# Patient Record
Sex: Male | Born: 1950 | Race: White | Hispanic: No | Marital: Married | Smoking: Never smoker
Health system: Southern US, Community
[De-identification: ages and names within clinical notes are randomized; demographics above are authoritative.]

## PROBLEM LIST (undated history)

## (undated) DIAGNOSIS — G4730 Sleep apnea, unspecified: Secondary | ICD-10-CM

## (undated) DIAGNOSIS — R918 Other nonspecific abnormal finding of lung field: Secondary | ICD-10-CM

## (undated) DIAGNOSIS — E785 Hyperlipidemia, unspecified: Secondary | ICD-10-CM

## (undated) DIAGNOSIS — M1990 Unspecified osteoarthritis, unspecified site: Secondary | ICD-10-CM

## (undated) DIAGNOSIS — K429 Umbilical hernia without obstruction or gangrene: Secondary | ICD-10-CM

## (undated) DIAGNOSIS — B159 Hepatitis A without hepatic coma: Secondary | ICD-10-CM

## (undated) DIAGNOSIS — E7800 Pure hypercholesterolemia, unspecified: Secondary | ICD-10-CM

## (undated) DIAGNOSIS — K649 Unspecified hemorrhoids: Secondary | ICD-10-CM

## (undated) DIAGNOSIS — I700 Atherosclerosis of aorta: Secondary | ICD-10-CM

## (undated) DIAGNOSIS — I4891 Unspecified atrial fibrillation: Secondary | ICD-10-CM

## (undated) DIAGNOSIS — K760 Fatty (change of) liver, not elsewhere classified: Secondary | ICD-10-CM

## (undated) DIAGNOSIS — I499 Cardiac arrhythmia, unspecified: Secondary | ICD-10-CM

## (undated) DIAGNOSIS — H409 Unspecified glaucoma: Secondary | ICD-10-CM

## (undated) DIAGNOSIS — N529 Male erectile dysfunction, unspecified: Secondary | ICD-10-CM

## (undated) HISTORY — PX: POLYPECTOMY: SHX149

## (undated) HISTORY — PX: APPENDECTOMY: SHX54

## (undated) HISTORY — PX: UMBILICAL HERNIA REPAIR: SHX196

---

## 1992-11-27 HISTORY — PX: KNEE ARTHROSCOPY: SHX127

## 2004-08-29 ENCOUNTER — Emergency Department: Payer: Self-pay | Admitting: Emergency Medicine

## 2005-08-04 ENCOUNTER — Ambulatory Visit: Payer: Self-pay | Admitting: Gastroenterology

## 2007-09-02 ENCOUNTER — Ambulatory Visit: Payer: Self-pay | Admitting: General Surgery

## 2008-11-17 ENCOUNTER — Ambulatory Visit: Payer: Self-pay | Admitting: Gastroenterology

## 2015-10-27 ENCOUNTER — Other Ambulatory Visit: Payer: Self-pay | Admitting: Orthopedic Surgery

## 2015-10-27 DIAGNOSIS — S83221A Peripheral tear of medial meniscus, current injury, right knee, initial encounter: Secondary | ICD-10-CM

## 2015-11-09 ENCOUNTER — Ambulatory Visit
Admission: RE | Admit: 2015-11-09 | Discharge: 2015-11-09 | Disposition: A | Discharge status: Home / Self Care | Payer: BC Managed Care – PPO | Source: Ambulatory Visit | Attending: Orthopedic Surgery | Admitting: Orthopedic Surgery

## 2015-11-09 DIAGNOSIS — S83221A Peripheral tear of medial meniscus, current injury, right knee, initial encounter: Secondary | ICD-10-CM | POA: Insufficient documentation

## 2015-11-09 DIAGNOSIS — X58XXXA Exposure to other specified factors, initial encounter: Secondary | ICD-10-CM | POA: Insufficient documentation

## 2015-11-09 DIAGNOSIS — M25561 Pain in right knee: Secondary | ICD-10-CM | POA: Diagnosis not present

## 2016-07-22 ENCOUNTER — Emergency Department: Payer: BC Managed Care – PPO

## 2016-07-22 ENCOUNTER — Encounter: Payer: Self-pay | Admitting: Emergency Medicine

## 2016-07-22 ENCOUNTER — Observation Stay
Admission: EM | Admit: 2016-07-22 | Discharge: 2016-07-24 | Disposition: A | Discharge status: Home / Self Care | Payer: BC Managed Care – PPO | Attending: Specialist | Admitting: Specialist

## 2016-07-22 DIAGNOSIS — Z808 Family history of malignant neoplasm of other organs or systems: Secondary | ICD-10-CM | POA: Insufficient documentation

## 2016-07-22 DIAGNOSIS — R110 Nausea: Secondary | ICD-10-CM | POA: Diagnosis present

## 2016-07-22 DIAGNOSIS — K831 Obstruction of bile duct: Secondary | ICD-10-CM | POA: Insufficient documentation

## 2016-07-22 DIAGNOSIS — R7401 Elevation of levels of liver transaminase levels: Secondary | ICD-10-CM

## 2016-07-22 DIAGNOSIS — E785 Hyperlipidemia, unspecified: Secondary | ICD-10-CM | POA: Insufficient documentation

## 2016-07-22 DIAGNOSIS — R7989 Other specified abnormal findings of blood chemistry: Secondary | ICD-10-CM | POA: Diagnosis not present

## 2016-07-22 DIAGNOSIS — R17 Unspecified jaundice: Secondary | ICD-10-CM | POA: Diagnosis present

## 2016-07-22 DIAGNOSIS — M1990 Unspecified osteoarthritis, unspecified site: Secondary | ICD-10-CM | POA: Insufficient documentation

## 2016-07-22 DIAGNOSIS — H409 Unspecified glaucoma: Secondary | ICD-10-CM | POA: Diagnosis not present

## 2016-07-22 DIAGNOSIS — K760 Fatty (change of) liver, not elsewhere classified: Principal | ICD-10-CM | POA: Insufficient documentation

## 2016-07-22 DIAGNOSIS — Z8371 Family history of colonic polyps: Secondary | ICD-10-CM | POA: Diagnosis not present

## 2016-07-22 DIAGNOSIS — K712 Toxic liver disease with acute hepatitis: Secondary | ICD-10-CM | POA: Insufficient documentation

## 2016-07-22 DIAGNOSIS — T360X5A Adverse effect of penicillins, initial encounter: Secondary | ICD-10-CM | POA: Insufficient documentation

## 2016-07-22 DIAGNOSIS — R740 Nonspecific elevation of levels of transaminase and lactic acid dehydrogenase [LDH]: Secondary | ICD-10-CM

## 2016-07-22 DIAGNOSIS — R935 Abnormal findings on diagnostic imaging of other abdominal regions, including retroperitoneum: Secondary | ICD-10-CM

## 2016-07-22 DIAGNOSIS — B179 Acute viral hepatitis, unspecified: Secondary | ICD-10-CM | POA: Diagnosis present

## 2016-07-22 HISTORY — DX: Male erectile dysfunction, unspecified: N52.9

## 2016-07-22 HISTORY — DX: Hepatitis a without hepatic coma: B15.9

## 2016-07-22 HISTORY — DX: Unspecified glaucoma: H40.9

## 2016-07-22 HISTORY — DX: Unspecified osteoarthritis, unspecified site: M19.90

## 2016-07-22 HISTORY — DX: Hyperlipidemia, unspecified: E78.5

## 2016-07-22 LAB — COMPREHENSIVE METABOLIC PANEL
ALBUMIN: 4.3 g/dL (ref 3.5–5.0)
ALK PHOS: 187 U/L — AB (ref 38–126)
ALT: 522 U/L — AB (ref 17–63)
AST: 222 U/L — ABNORMAL HIGH (ref 15–41)
Anion gap: 9 (ref 5–15)
BILIRUBIN TOTAL: 9.5 mg/dL — AB (ref 0.3–1.2)
BUN: 12 mg/dL (ref 6–20)
CALCIUM: 9 mg/dL (ref 8.9–10.3)
CO2: 23 mmol/L (ref 22–32)
CREATININE: 0.65 mg/dL (ref 0.61–1.24)
Chloride: 105 mmol/L (ref 101–111)
GFR calc Af Amer: >60 mL/min (ref >60)
GFR calc non Af Amer: >60 mL/min (ref >60)
GLUCOSE: 111 mg/dL — AB (ref 65–99)
Potassium: 3.6 mmol/L (ref 3.5–5.1)
SODIUM: 137 mmol/L (ref 135–145)
TOTAL PROTEIN: 8 g/dL (ref 6.5–8.1)

## 2016-07-22 LAB — PROTIME-INR
INR: 0.87
PROTHROMBIN TIME: 11.8 s (ref 11.4–15.2)

## 2016-07-22 LAB — LIPASE, BLOOD: Lipase: 27 U/L (ref 11–51)

## 2016-07-22 LAB — BILIRUBIN, DIRECT: Bilirubin, Direct: 5.6 mg/dL — ABNORMAL HIGH (ref 0.1–0.5)

## 2016-07-22 LAB — ACETAMINOPHEN LEVEL: Acetaminophen (Tylenol), Serum: <10 ug/mL — ABNORMAL LOW (ref 10–30)

## 2016-07-22 LAB — APTT: aPTT: 30 seconds (ref 24–36)

## 2016-07-22 LAB — GAMMA GT: GGT: 662 U/L — ABNORMAL HIGH (ref 7–50)

## 2016-07-22 MED ORDER — SODIUM CHLORIDE 0.9 % IV BOLUS (SEPSIS)
500.0000 mL | Freq: Once | INTRAVENOUS | Status: AC
  Administered 2016-07-22: 500 mL via INTRAVENOUS

## 2016-07-22 MED ORDER — ALBUTEROL SULFATE (2.5 MG/3ML) 0.083% IN NEBU: 2.5000 mg | INHALATION_SOLUTION | RESPIRATORY_TRACT | Status: DC | PRN

## 2016-07-22 MED ORDER — DIPHENHYDRAMINE HCL 25 MG PO CAPS
25.0000 mg | ORAL_CAPSULE | Freq: Four times a day (QID) | ORAL | Status: DC | PRN
  Administered 2016-07-23 (×3): 25 mg via ORAL
  Filled 2016-07-22 (×3): qty 1

## 2016-07-22 MED ORDER — IOPAMIDOL (ISOVUE-300) INJECTION 61%
100.0000 mL | Freq: Once | INTRAVENOUS | Status: AC | PRN
  Administered 2016-07-22: 100 mL via INTRAVENOUS

## 2016-07-22 MED ORDER — POLYETHYLENE GLYCOL 3350 17 G PO PACK: 17.0000 g | PACK | Freq: Every day | ORAL | Status: DC | PRN

## 2016-07-22 MED ORDER — IBUPROFEN 400 MG PO TABS
400.0000 mg | ORAL_TABLET | Freq: Four times a day (QID) | ORAL | Status: DC | PRN
Start: 2016-07-22 — End: 2016-07-24
  Filled 2016-07-22: qty 1

## 2016-07-22 MED ORDER — DIPHENHYDRAMINE HCL 50 MG/ML IJ SOLN
12.5000 mg | Freq: Once | INTRAMUSCULAR | Status: AC
  Administered 2016-07-22: 12.5 mg via INTRAVENOUS
  Filled 2016-07-22: qty 1

## 2016-07-22 MED ORDER — DIATRIZOATE MEGLUMINE & SODIUM 66-10 % PO SOLN
15.0000 mL | Freq: Once | ORAL | Status: AC
  Administered 2016-07-22: 15 mL via ORAL

## 2016-07-22 MED ORDER — POTASSIUM CHLORIDE IN NACL 20-0.9 MEQ/L-% IV SOLN
INTRAVENOUS | Status: DC
  Administered 2016-07-22 – 2016-07-23 (×4): via INTRAVENOUS
  Filled 2016-07-22 (×6): qty 1000

## 2016-07-22 MED ORDER — ONDANSETRON HCL 4 MG/2ML IJ SOLN: 4.0000 mg | Freq: Four times a day (QID) | INTRAMUSCULAR | Status: DC | PRN

## 2016-07-22 MED ORDER — ONDANSETRON HCL 4 MG PO TABS: 4.0000 mg | ORAL_TABLET | Freq: Four times a day (QID) | ORAL | Status: DC | PRN

## 2016-07-22 NOTE — Consult Note (Signed)
Hepatology Inpatient Consult Note  Reason for Consult:jaundice   Attending Requesting Consult:Dr. Sudini  History of Present Illness: Darrell Burton is a 65 y.o. male who had URI and took Augmentin and prednisone for 10 days in early August.  Two weeks later he had onset of nausea, reflux, stomach burning with eating.  His urine became darker as days went by.  He also had purritis  Which worsened as time went by.He had a urine culture that was neg.  He had blood work done that showed very elevated LFT"S and was admitted to the hospital.  He was in [place] 7/15 to 7/29.  Stools have become light brown and urine dark orange.  Past Medical History:  Past Medical History:  Diagnosis Date  . Erectile dysfunction   . Glaucoma   . Hepatitis A   . Hyperlipidemia   . Osteoarthritis     Problem List: Patient Active Problem List   Diagnosis Date Noted  . Acute hepatitis 07/22/2016    Past Surgical History: History reviewed. No pertinent surgical history.  Allergies: No Known Allergies  Home Medications: Prescriptions Prior to Admission  Medication Sig Dispense Refill Last Dose  . albuterol (PROVENTIL HFA;VENTOLIN HFA) 108 (90 Base) MCG/ACT inhaler Inhale 2 puffs into the lungs every 6 (six) hours as needed for wheezing.   Past Month at Unknown time  . aspirin EC 81 MG tablet Take 81 mg by mouth every morning.   07/19/2016  . latanoprost (XALATAN) 0.005 % ophthalmic solution Place 1 drop into both eyes at bedtime.   07/21/2016 at Unknown time  . montelukast (SINGULAIR) 10 MG tablet Take 10 mg by mouth daily as needed for allergies.   never used   Home medication reconciliation was completed with the patient.   Scheduled Inpatient Medications:     Continuous Inpatient Infusions:   . 0.9 % NaCl with KCl 20 mEq / L 100 mL/hr at 07/22/16 1906    PRN Inpatient Medications:  albuterol, ibuprofen, ondansetron **OR** ondansetron (ZOFRAN) IV, polyethylene glycol  Family History: family  history includes Colon polyps in his father; Melanoma in his father.  The patient's family history is negative for inflammatory bowel disorders, GI malignancy, or solid organ transplantation.  Social History:   reports that he has never smoked. He has never used smokeless tobacco. He reports that he does not drink alcohol or use drugs. The patient denies ETOH, tobacco, or drug use.   Review of Systems: Constitutional: Weight is stable.  Eyes: No changes in vision. ENT: No oral lesions, sore throat.  GI: see HPI.  Heme/Lymph: No easy bruising.  CV: No chest pain.  GU: No hematuria.  Integumentary: No rashes.  Neuro: No headaches.  Psych: No depression/anxiety.  Endocrine: No heat/cold intolerance.  Allergic/Immunologic: No urticaria.  Resp: No cough, SOB.  Musculoskeletal: No joint swelling.    Physical Examination: BP 133/81 (BP Location: Left Arm)   Pulse 60   Temp 98.1 F (36.7 C) (Oral)   Resp 16   Ht 5\' 8"  (1.727 m)   Wt 103 kg (227 lb)   SpO2 97%   BMI 34.52 kg/m  Gen: NAD, alert and oriented x 4 HEENT: PEERLA, EOMI, Neck: supple, no JVD or thyromegaly Chest: CTA bilaterally, no wheezes, crackles, or other adventitious sounds CV: RRR, no m/g/c/r Abd: soft, NT, ND, +BS in all four quadrants; no HSM, guarding, ridigity, or rebound tenderness Ext: no edema, well perfused with 2+ pulses, Skin: no rash or lesions noted Lymph: no  LAD  Data: No results found for: WBC, HGB, HCT, MCV, PLT No results for input(s): HGB in the last 168 hours. Lab Results  Component Value Date   NA 137 07/22/2016   K 3.6 07/22/2016   CL 105 07/22/2016   CO2 23 07/22/2016   BUN 12 07/22/2016   CREATININE 0.65 07/22/2016   Lab Results  Component Value Date   ALT 522 (H) 07/22/2016   AST 222 (H) 07/22/2016   ALKPHOS 187 (H) 07/22/2016   BILITOT 9.5 (H) 07/22/2016    Recent Labs Lab 07/22/16 1411  APTT 30  INR 0.87   Assessment/Plan: Darrell Burton is a 65 y.o. male with recent  Augmentin for URI.  Augmentin is one of the most common causes of drug induced liver disease.  This is likely the cause of this illness.  He needs to avoid this medicine and penicillin if at all possible.  Will follow with you.  Check LFT's tomorrow, when trending down can go home and follow up as an outpatient.  Will give Benadryl for sleep and mild puritis.  Recommendations:  Thank you for the consult. Please call with questions or concerns.  Gaylyn Cheers, MD

## 2016-07-22 NOTE — ED Provider Notes (Signed)
[facility] Emergency Department Provider Note    First MD Initiated Contact with Patient 07/22/16 1353     (approximate)  I have reviewed the triage vital signs and the nursing notes.   HISTORY  Chief Complaint Abnormal Lab    HPI Darrell Burton is a 65 y.o. male presents to the ER at the direction of his primary care physician due to abnormal labs. Patient states that he has been having generalized nausea over the past week. He also noticed that he started becoming jaundiced. Would intermittently have very brief episodes of right upper quadrant pain but none today. Went into his primary care physician for evaluation related routine lab work and noted that he had significant transaminitis with an elevated bilirubin.  He admits to occasional alcohol use. No recent Tylenol use. Does have a family history of pancreatic cancer. Has noted 4 days of acholic stool.  Denies any previous surgeries to his abdomen. No recent infections or fevers. No new medications.   Past Medical History:  Diagnosis Date  . Hepatitis A     There are no active problems to display for this patient.   PSH: No recent surgeries  Prior to Admission medications   Not on File    Allergies Review of patient's allergies indicates no known allergies.  [place]:  Mother had pancreatic cancer  Social History Social History  Substance Use Topics  . Smoking status: Not on file  . Smokeless tobacco: Not on file  . Alcohol use Not on file    Review of Systems Patient denies headaches, rhinorrhea, blurry vision, numbness, shortness of breath, chest pain, edema, cough, abdominal pain, nausea, vomiting, diarrhea, dysuria, fevers, rashes or hallucinations unless otherwise stated above in HPI. ____________________________________________   PHYSICAL EXAM:  VITAL SIGNS: Vitals:   07/22/16 1251  BP: 135/87  Pulse: (!) 57  Resp: 18  Temp: 98.9 F (37.2 C)    Constitutional: Alert and  oriented. Well appearing and in no acute distress. Eyes: Conjunctivae jaundiced. PERRL. EOMI. Head: Atraumatic. Nose: No congestion/rhinnorhea. Mouth/Throat: Mucous membranes are moist.  Oropharynx non-erythematous. Neck: No stridor. Painless ROM. No cervical spine tenderness to palpation Hematological/Lymphatic/Immunilogical: No cervical lymphadenopathy. Cardiovascular: Normal rate, regular rhythm. Grossly normal heart sounds.  Good peripheral circulation. Respiratory: Normal respiratory effort.  No retractions. Lungs CTAB. Gastrointestinal: Soft and nontender. No distention. No abdominal bruits. No CVA tenderness. Genitourinary:  Musculoskeletal: No lower extremity tenderness nor edema.  No joint effusions. Neurologic:  Normal speech and language. No gross focal neurologic deficits are appreciated. No gait instability. Skin:  Skin is warm, dry and intact. No rash noted. Psychiatric: Mood and affect are normal. Speech and behavior are normal.  ____________________________________________   LABS (all labs ordered are listed, but only abnormal results are displayed)  Results for orders placed or performed during the hospital encounter of 07/22/16 (from the past 24 hour(s))  Protime-INR     Status: None   Collection Time: 07/22/16  2:11 PM  Result Value Ref Range   Prothrombin Time 11.8 11.4 - 15.2 seconds   INR 0.87   Lipase, blood     Status: None   Collection Time: 07/22/16  2:11 PM  Result Value Ref Range   Lipase 27 11 - 51 U/L   ____________________________________________   ____________________________________________  RADIOLOGY RUQ [place] with  IMPRESSION: 1. Gallbladder wall thickening without evidence for stones, pericholecystic fluid, or sonographic Murphy sign. The thickening may be related to the underdistended state of the gallbladder.  If clinical exam is equivocal for cholecystitis, nuclear scintigraphy may prove helpful to further evaluate. 2. No intra or  extrahepatic biliary duct dilatation. 3. Increased echogenicity of the liver parenchyma likely related to steatosis.   CT abd IMPRESSION: 1. Gallbladder wall thickening without evidence for stones, pericholecystic fluid, or sonographic Murphy sign. The thickening may be related to the underdistended state of the gallbladder. If clinical exam is equivocal for cholecystitis, nuclear scintigraphy may prove helpful to further evaluate. 2. No intra or extrahepatic biliary duct dilatation. 3. Increased echogenicity of the liver parenchyma likely related to steatosis.  ____________________________________________   PROCEDURES  Procedure(s) performed: none    Critical Care performed: no ____________________________________________   INITIAL IMPRESSION / ASSESSMENT AND PLAN / ED COURSE  Pertinent labs & imaging results that were available during my care of the patient were reviewed by me and considered in my medical decision making (see chart for details).  DDX: Pancreatic mass, cholelithiasis, cholecystitis, hemolysis, viral hepatitis  Darrell Burton is a 65 y.o. who presents to the ED with jaundice, acholic stools and nausea. Patient with elevated bilirubin as well as transaminitis from labs drawn at clinic today. Patient currently afebrile and denies any recent fevers or chills. Do not suspect acute infectious process. Has a remote history of hepatitis a. No risk factors for hepatitis C. Has received his hepatitis B vaccine's. Based on his abdominal exam and acute jaundice we will order a right upper quadrant ultrasound to evaluate for cholelithiasis.  Clinical Course  Comment By Time  Ultrasound are reviewed and showed no evidence of acute obstructive process. White count does not show any evidence of acute leukocytosis. INR is normal. Lipase is normal. Due to family history pancreatic cancer will order CT imaging to further evaluate for obstructive jaundice. Merlyn Lot, MD  08/26 1516  CT imaging reviewed and shows no acute obstructive process.   Merlyn Lot, MD 08/26 1530  Spoke with Dr. Vira Agar regarding the patient's complex presentation.  Given the patient's persistent symptoms and elevated bilirubin Dr. Vira Agar has agreed to consult on patient for further evaluation. At this point differential does include acute hepatitis, liver failure and cirrhosis. While CT imaging does not show any evidence of pancreatic mass he is an appropriate age range to be concerned about biliary malignancy. I sent off additional laboratory testing to further characterize. Will speak with hospitalist regarding admission for further evaluation and management.  Have discussed with the patient and available family all diagnostics and treatments performed thus far and all questions were answered to the best of my ability. The patient demonstrates understanding and agreement with plan.  Merlyn Lot, MD 08/26 1548     ____________________________________________   FINAL CLINICAL IMPRESSION(S) / ED DIAGNOSES  Final diagnoses:  Jaundice  Total bilirubin, elevated  Transaminitis  Nausea  Abnormal [place] (ultrasound) of abdomen      NEW MEDICATIONS STARTED DURING THIS VISIT:  New Prescriptions   No medications on file     Note:  This document was prepared using Dragon voice recognition software and may include unintentional dictation errors.    Merlyn Lot, MD 07/22/16 [phone]

## 2016-07-22 NOTE — ED Notes (Signed)
Patient transported to Ultrasound 

## 2016-07-22 NOTE — H&P (Signed)
[facility] [place] NAME: Darrell Burton    MR#:  [id number]  DATE OF BIRTH:  07/19/1951  DATE OF ADMISSION:  07/22/2016  PRIMARY CARE PHYSICIAN: Kirk Ruths., MD   REQUESTING/REFERRING PHYSICIAN: Dr. Quentin Cornwall  CHIEF COMPLAINT:   Chief Complaint  Patient presents with  . Abnormal Lab    HISTORY OF PRESENT ILLNESS:  Darrell Burton  is a 65 y.o. male with a known history of Hepatitis A as a child, arthritis, glaucoma presents to the emergency room due to not feeling well for the past few days. He has noticed nausea and some mild abdominal pain. Noticed dark urine and light-colored stools. Today he was seen at urgent care and CMP showed elevated bilirubin and liver enzymes and sent to emergency room. Here his CT scan of the abdomen pelvis shows hepatic steatosis with no mass or obstruction. Patient is being admitted for further workup of his hepatitis.  Patient use to 10 days of prednisone and Augmentin for upper respiratory infection recently.  Up-to-date on hepatitis B vaccination.  Recent travel to [place] and return 2 weeks back.  PAST MEDICAL HISTORY:   Past Medical History:  Diagnosis Date  . Erectile dysfunction   . Glaucoma   . Hepatitis A   . Hyperlipidemia   . Osteoarthritis     PAST SURGICAL HISTORY:  No past surgical history on file.  SOCIAL HISTORY:   Social History  Substance Use Topics  . Smoking status: Never Smoker  . Smokeless tobacco: Never Used  . Alcohol use No    FAMILY HISTORY:   Family History  Problem Relation Age of Onset  . Colon polyps Father   . Melanoma Father     DRUG ALLERGIES:  No Known Allergies  REVIEW OF SYSTEMS:   Review of Systems  Constitutional: Positive for malaise/fatigue. Negative for chills and fever.  HENT: Negative for sore throat.   Eyes: Negative for blurred vision, double vision and pain.  Respiratory: Negative for cough, hemoptysis, shortness of  breath and wheezing.   Cardiovascular: Negative for chest pain, palpitations, orthopnea and leg swelling.  Gastrointestinal: Positive for abdominal pain and nausea. Negative for constipation, diarrhea, heartburn and vomiting.  Genitourinary: Negative for dysuria and hematuria.  Musculoskeletal: Negative for back pain and joint pain.  Skin: Negative for rash.  Neurological: Negative for sensory change, speech change, focal weakness and headaches.  Endo/Heme/Allergies: Does not bruise/bleed easily.  Psychiatric/Behavioral: Negative for depression. The patient is not nervous/anxious.     MEDICATIONS AT HOME:   Prior to Admission medications   Not on File     VITAL SIGNS:  Blood pressure (!) 147/90, pulse 68, temperature 98.9 F (37.2 C), temperature source Oral, resp. rate (!) 23, height 5\' 8"  (1.727 m), weight 103 kg (227 lb), SpO2 95 %.  PHYSICAL EXAMINATION:  Physical Exam  GENERAL:  65 y.o.-year-old patient lying in the bed with no acute distress.  EYES: Pupils equal, round, reactive to light and accommodation. No scleral icterus. Extraocular muscles intact.  HEENT: Head atraumatic, normocephalic. Oropharynx and nasopharynx clear. No oropharyngeal erythema, moist oral mucosa  NECK:  Supple, no jugular venous distention. No thyroid enlargement, no tenderness.  LUNGS: Normal breath sounds bilaterally, no wheezing, rales, rhonchi. No use of accessory muscles of respiration.  CARDIOVASCULAR: S1, S2 normal. No murmurs, rubs, or gallops.  ABDOMEN: Soft, nontender, nondistended. Bowel sounds present. No organomegaly or mass.  EXTREMITIES: No pedal edema, cyanosis, or clubbing. + 2 pedal &  radial pulses b/l.   NEUROLOGIC: Cranial nerves II through XII are intact. No focal Motor or sensory deficits appreciated b/l PSYCHIATRIC: The patient is alert and oriented x 3. Good affect.  SKIN: No obvious rash, lesion, or ulcer.   LABORATORY PANEL:   CBC No results for input(s): WBC, HGB, HCT,  PLT in the last 168 hours. ------------------------------------------------------------------------------------------------------------------  Chemistries   Recent Labs Lab 07/22/16 1411  NA 137  K 3.6  CL 105  CO2 23  GLUCOSE 111*  BUN 12  CREATININE 0.65  CALCIUM 9.0  AST 222*  ALT 522*  ALKPHOS 187*  BILITOT 9.5*   ------------------------------------------------------------------------------------------------------------------  Cardiac Enzymes No results for input(s): TROPONINI in the last 168 hours. ------------------------------------------------------------------------------------------------------------------  RADIOLOGY:  Ct Abdomen Pelvis W Contrast  Result Date: 07/22/2016 CLINICAL DATA:  Dark urine.  Eighteen.  Jaundice. EXAM: CT ABDOMEN AND PELVIS WITH CONTRAST TECHNIQUE: Multidetector CT imaging of the abdomen and pelvis was performed using the standard protocol following bolus administration of intravenous contrast. CONTRAST:  137mL ISOVUE-300 IOPAMIDOL (ISOVUE-300) INJECTION 61% COMPARISON:  None. FINDINGS: Lower chest: Pulmonary nodule within the left lower lobe measures 1.8 x 1.0 cm (mean diameter 14 mm), image 12 of series 4. Hepatobiliary: Hepatic steatosis is identified. The gallbladder appears normal. No biliary dilatation. Pancreas: No mass, inflammatory changes, or other significant abnormality. Spleen: Within normal limits in size and appearance. Adrenals/Urinary Tract: No masses identified. No evidence of hydronephrosis. Stomach/Bowel: No evidence of obstruction, inflammatory process, or abnormal fluid collections. Vascular/Lymphatic: Calcified atherosclerotic disease involves the abdominal aorta. No aneurysm. No enlarged retroperitoneal or mesenteric adenopathy. No enlarged pelvic or inguinal lymph nodes. Reproductive: No mass or other significant abnormality. Other: None. Musculoskeletal:  No suspicious bone lesions identified. IMPRESSION: 1. No acute findings  within the abdomen or pelvis. 2. Hepatic steatosis. 3. **An incidental finding of potential clinical significance has been found. Pulmonary nodule in the left lower lobe has a mean diameter of 14 mm. Consider one of the following in 3 months for both low-risk and high-risk individuals: (a) repeat chest CT, (b) follow-up PET-CT, or (c) tissue sampling. This recommendation follows the consensus statement: Guidelines for Management of Incidental Pulmonary Nodules Detected on CT Images:From the Fleischner Society 2017; published online before print (10.1148/radiol.IJ:2314499).** Electronically Signed   By: Kerby Moors M.D.   On: 07/22/2016 15:16   US Abdomen Limited Ruq  Result Date: 07/22/2016 CLINICAL DATA:  One week history of jaundice. EXAM: US ABDOMEN LIMITED - RIGHT UPPER QUADRANT COMPARISON:  None. FINDINGS: Gallbladder: Gallbladder is nondistended. Gallbladder wall is thickened at 5-6 mm, but this may be related to the decompressed state. The sonographer reports no sonographic Murphy sign. There is no pericholecystic fluid. Common bile duct: Diameter: 4 mm.  Nondilated. Liver: Diffuse coarsening of the hepatic echotexture is compatible with fatty deposition. No focal abnormality identified. No intrahepatic biliary duct dilatation is evident. IMPRESSION: 1. Gallbladder wall thickening without evidence for stones, pericholecystic fluid, or sonographic Murphy sign. The thickening may be related to the underdistended state of the gallbladder. If clinical exam is equivocal for cholecystitis, nuclear scintigraphy may prove helpful to further evaluate. 2. No intra or extrahepatic biliary duct dilatation. 3. Increased echogenicity of the liver parenchyma likely related to steatosis. Electronically Signed   By: Misty Stanley M.D.   On: 07/22/2016 13:51     IMPRESSION AND PLAN:   * Acute hepatitis and obstructive jaundice likely due to hepatic cholestasis Possible drug induced from Augmentin. Will need to  rule out with  acute hepatitis panel. Ordered antimitochondrial and high anti-smoke muscle antibodies. ANA panel. Consult gastroenterology. Admit under observation to trend liver enzymes. Likely discharge tomorrow if improving. CT scan of the abdomen and pelvis along with right upper quadrant ultrasound showed hepatic steatosis. No mass or cirrhosis. Check PT and PTT. Tylenol level normal.  * Elevation blood pressure without diagnosis of hypertension Monitor. Add medications if consistently elevated.  * DVT prophylaxis with SCDs  All the records are reviewed and case discussed with ED provider. Management plans discussed with the patient, family and they are in agreement.  CODE STATUS: FULL CODE  TOTAL TIME TAKING CARE OF THIS PATIENT: 40 minutes.   Hillary Bow R M.D on 07/22/2016 at 4:31 PM  Between 7am to 6pm - Pager - [phone]  After 6pm go to [email or web address] - password [email or web address] [facility]  Office  [phone]  CC: Primary care physician; Kirk Ruths., MD  Note: This dictation was prepared with Dragon dictation along with smaller phrase technology. Any transcriptional errors that result from this process are unintentional.

## 2016-07-22 NOTE — ED Triage Notes (Signed)
Patient from [facility] with Abnormal Liver Enzymes. Patient states that Dr. Juleen China sent him over to receive a CT Abd to rule out biliary/hepatic obstruction/dysfunction. Patient states he is weaker and very jaundice

## 2016-07-23 LAB — HEPATITIS PANEL, ACUTE
HCV Ab: <0.1 s/co ratio (ref 0.0–0.9)
HEP A IGM: NEGATIVE
Hep B C IgM: NEGATIVE
Hepatitis B Surface Ag: NEGATIVE

## 2016-07-23 LAB — PROTIME-INR
INR: 0.94
PROTHROMBIN TIME: 12.6 s (ref 11.4–15.2)

## 2016-07-23 LAB — HEPATIC FUNCTION PANEL
ALBUMIN: 3.6 g/dL (ref 3.5–5.0)
ALT: 413 U/L — ABNORMAL HIGH (ref 17–63)
AST: 165 U/L — AB (ref 15–41)
Alkaline Phosphatase: 160 U/L — ABNORMAL HIGH (ref 38–126)
BILIRUBIN DIRECT: 5.3 mg/dL — AB (ref 0.1–0.5)
BILIRUBIN TOTAL: 8.8 mg/dL — AB (ref 0.3–1.2)
Indirect Bilirubin: 3.5 mg/dL — ABNORMAL HIGH (ref 0.3–0.9)
Total Protein: 6.8 g/dL (ref 6.5–8.1)

## 2016-07-23 MED ORDER — LATANOPROST 0.005 % OP SOLN
1.0000 [drp] | Freq: Every day | OPHTHALMIC | Status: DC
  Filled 2016-07-23: qty 2.5

## 2016-07-23 NOTE — Consult Note (Signed)
Patient LFT's falling.  AST 222 to 165,  ALT 522 to 413, TB 9.5 to 8.8, PT 12.6 sec.  I want to be sure these are actually falling and not simply diluted out with iv fluids.  Repeat LFT in morning tomorrow and if continuing to go down could likely go home tomorrow and follow up with me.  Pt with some nausea with breakfast.

## 2016-07-23 NOTE — Progress Notes (Signed)
[place] [facility] [place] NAME: Darrell Burton    MR#:  [id number]  DATE OF BIRTH:  Jul 02, 1951  SUBJECTIVE:   Pt. Here due to Cholestatic jaundice. LFTs are improved. Still having some itching.  REVIEW OF SYSTEMS:    Review of Systems  Constitutional: Negative for chills and fever.  HENT: Negative for congestion and tinnitus.   Eyes: Negative for blurred vision and double vision.  Respiratory: Negative for cough, shortness of breath and wheezing.   Cardiovascular: Negative for chest pain, orthopnea and PND.  Gastrointestinal: Negative for abdominal pain, diarrhea, nausea and vomiting.  Genitourinary: Negative for dysuria and hematuria.  Skin: Positive for itching.  Neurological: Negative for dizziness, sensory change and focal weakness.  All other systems reviewed and are negative.   Nutrition: Heart healthy Tolerating Diet: Yes Tolerating PT: Ambulatory   DRUG ALLERGIES:  No Known Allergies  VITALS:  Blood pressure 135/77, pulse (!) 50, temperature 98 F (36.7 C), temperature source Oral, resp. rate 20, height 5\' 8"  (1.727 m), weight 99.3 kg (219 lb), SpO2 95 %.  PHYSICAL EXAMINATION:   Physical Exam  GENERAL:  65 y.o.-year-old patient sitting up in bed in no acute distress.  EYES: Pupils equal, round, reactive to light and accommodation. + scleral icterus. Extraocular muscles intact.  HEENT: Head atraumatic, normocephalic. Oropharynx and nasopharynx clear.  NECK:  Supple, no jugular venous distention. No thyroid enlargement, no tenderness.  LUNGS: Normal breath sounds bilaterally, no wheezing, rales, rhonchi. No use of accessory muscles of respiration.  CARDIOVASCULAR: S1, S2 normal. No murmurs, rubs, or gallops.  ABDOMEN: Soft, nontender, nondistended. Bowel sounds present. No organomegaly or mass.  EXTREMITIES: No cyanosis, clubbing or edema b/l.    NEUROLOGIC: Cranial nerves II through XII are intact. No focal Motor or sensory  deficits b/l.   PSYCHIATRIC: The patient is alert and oriented x 3.  SKIN: No obvious rash, lesion, or ulcer.    LABORATORY PANEL:   CBC No results for input(s): WBC, HGB, HCT, PLT in the last 168 hours. ------------------------------------------------------------------------------------------------------------------  Chemistries   Recent Labs Lab 07/22/16 1411 07/23/16 0542  NA 137  --   K 3.6  --   CL 105  --   CO2 23  --   GLUCOSE 111*  --   BUN 12  --   CREATININE 0.65  --   CALCIUM 9.0  --   AST 222* 165*  ALT 522* 413*  ALKPHOS 187* 160*  BILITOT 9.5* 8.8*   ------------------------------------------------------------------------------------------------------------------  Cardiac Enzymes No results for input(s): TROPONINI in the last 168 hours. ------------------------------------------------------------------------------------------------------------------  RADIOLOGY:  Ct Abdomen Pelvis W Contrast  Result Date: 07/22/2016 CLINICAL DATA:  Dark urine.  Eighteen.  Jaundice. EXAM: CT ABDOMEN AND PELVIS WITH CONTRAST TECHNIQUE: Multidetector CT imaging of the abdomen and pelvis was performed using the standard protocol following bolus administration of intravenous contrast. CONTRAST:  13mL ISOVUE-300 IOPAMIDOL (ISOVUE-300) INJECTION 61% COMPARISON:  None. FINDINGS: Lower chest: Pulmonary nodule within the left lower lobe measures 1.8 x 1.0 cm (mean diameter 14 mm), image 12 of series 4. Hepatobiliary: Hepatic steatosis is identified. The gallbladder appears normal. No biliary dilatation. Pancreas: No mass, inflammatory changes, or other significant abnormality. Spleen: Within normal limits in size and appearance. Adrenals/Urinary Tract: No masses identified. No evidence of hydronephrosis. Stomach/Bowel: No evidence of obstruction, inflammatory process, or abnormal fluid collections. Vascular/Lymphatic: Calcified atherosclerotic disease involves the abdominal aorta. No  aneurysm. No enlarged retroperitoneal or mesenteric adenopathy. No enlarged pelvic  or inguinal lymph nodes. Reproductive: No mass or other significant abnormality. Other: None. Musculoskeletal:  No suspicious bone lesions identified. IMPRESSION: 1. No acute findings within the abdomen or pelvis. 2. Hepatic steatosis. 3. **An incidental finding of potential clinical significance has been found. Pulmonary nodule in the left lower lobe has a mean diameter of 14 mm. Consider one of the following in 3 months for both low-risk and high-risk individuals: (a) repeat chest CT, (b) follow-up PET-CT, or (c) tissue sampling. This recommendation follows the consensus statement: Guidelines for Management of Incidental Pulmonary Nodules Detected on CT Images:From the Fleischner Society 2017; published online before print (10.1148/radiol.SG:5268862).** Electronically Signed   By: Kerby Moors M.D.   On: 07/22/2016 15:16   US Abdomen Limited Ruq  Result Date: 07/22/2016 CLINICAL DATA:  One week history of jaundice. EXAM: US ABDOMEN LIMITED - RIGHT UPPER QUADRANT COMPARISON:  None. FINDINGS: Gallbladder: Gallbladder is nondistended. Gallbladder wall is thickened at 5-6 mm, but this may be related to the decompressed state. The sonographer reports no sonographic Murphy sign. There is no pericholecystic fluid. Common bile duct: Diameter: 4 mm.  Nondilated. Liver: Diffuse coarsening of the hepatic echotexture is compatible with fatty deposition. No focal abnormality identified. No intrahepatic biliary duct dilatation is evident. IMPRESSION: 1. Gallbladder wall thickening without evidence for stones, pericholecystic fluid, or sonographic Murphy sign. The thickening may be related to the underdistended state of the gallbladder. If clinical exam is equivocal for cholecystitis, nuclear scintigraphy may prove helpful to further evaluate. 2. No intra or extrahepatic biliary duct dilatation. 3. Increased echogenicity of the liver  parenchyma likely related to steatosis. Electronically Signed   By: Misty Stanley M.D.   On: 07/22/2016 13:51     ASSESSMENT AND PLAN:   65 year old male with past medical history of cervical arthritis, hyperlipidemia, history of hepatitis A, glaucoma who presented to the hospital due to abnormal LFTs and cholestatic jaundice  1. Abnormal LFTs/cholestatic jaundice-secondary to Augmentin that patient took about 2 weeks ago. -Seen by gastroenterology who agreed with this diagnosis. -LFTs improving, we'll continue to monitor. If improved tomorrow possible DC home.  2. Glaucoma - cont. Greggory Keen  All the records are reviewed and case discussed with Care Management/Social Worker. Management plans discussed with the patient, family and they are in agreement.  CODE STATUS: Full   DVT Prophylaxis: Ambulatory  TOTAL TIME TAKING CARE OF THIS PATIENT: 30 minutes.   POSSIBLE D/C IN 1-2 DAYS, DEPENDING ON CLINICAL CONDITION.   Henreitta Leber M.D on 07/23/2016 at 1:48 PM  Between 7am to 6pm - Pager - [phone]  After 6pm go to [email or web address] - Proofreader  [facility]  Office  [phone]  CC: Primary care physician; Kirk Ruths., MD

## 2016-07-24 LAB — COMPREHENSIVE METABOLIC PANEL
ALBUMIN: 3.8 g/dL (ref 3.5–5.0)
ALT: 363 U/L — AB (ref 17–63)
ANION GAP: 4 — AB (ref 5–15)
AST: 144 U/L — AB (ref 15–41)
Alkaline Phosphatase: 176 U/L — ABNORMAL HIGH (ref 38–126)
BILIRUBIN TOTAL: 9 mg/dL — AB (ref 0.3–1.2)
BUN: 11 mg/dL (ref 6–20)
CO2: 26 mmol/L (ref 22–32)
Calcium: 9.1 mg/dL (ref 8.9–10.3)
Chloride: 108 mmol/L (ref 101–111)
Creatinine, Ser: 0.63 mg/dL (ref 0.61–1.24)
Glucose, Bld: 96 mg/dL (ref 65–99)
POTASSIUM: 4.1 mmol/L (ref 3.5–5.1)
SODIUM: 138 mmol/L (ref 135–145)
TOTAL PROTEIN: 7 g/dL (ref 6.5–8.1)

## 2016-07-24 NOTE — Discharge Summary (Signed)
[place] at [place] NAME: Darrell Burton    MR#:  [id number]  DATE OF BIRTH:  12/10/1950  DATE OF ADMISSION:  07/22/2016 ADMITTING PHYSICIAN: Hillary Bow, MD  DATE OF DISCHARGE: 07/24/2016 12:28 PM  PRIMARY CARE PHYSICIAN: Kirk Ruths., MD    ADMISSION DIAGNOSIS:  Nausea [R11.0] Jaundice [R17] Transaminitis [R74.0] Abnormal [place] (ultrasound) of abdomen [R93.5] Total bilirubin, elevated [R17]  DISCHARGE DIAGNOSIS:  Active Problems:   Acute hepatitis   SECONDARY DIAGNOSIS:   Past Medical History:  Diagnosis Date  . Erectile dysfunction   . Glaucoma   . Hepatitis A   . Hyperlipidemia   . Osteoarthritis     HOSPITAL COURSE:   65 year old male with past medical history of cervical arthritis, hyperlipidemia, history of hepatitis A, glaucoma who presented to the hospital due to abnormal LFTs and cholestatic jaundice  1. Abnormal LFTs/cholestatic jaundice-secondary to Augmentin that patient took about 2 weeks ago. -Patient was observed in the hospital and have his LFTs followed and they have trended down. His hepatitis panel was negative, his autoimmune workup is currently still pending. -He has clinically improved and will continue some as needed Benadryl for the itching. -His imaging studies only showed evidence of hepatic steatosis but no evidence of acute obstruction. -Patient will be discharged home with follow-up with gastroenterology next week.  2. Glaucoma - he will cont. Greggory Keen  DISCHARGE CONDITIONS:   Stable  CONSULTS OBTAINED:  Treatment Team:  Manya Silvas, MD  DRUG ALLERGIES:  No Known Allergies  DISCHARGE MEDICATIONS:     Medication List    TAKE these medications   albuterol 108 (90 Base) MCG/ACT inhaler Commonly known as:  PROVENTIL HFA;VENTOLIN HFA Inhale 2 puffs into the lungs every 6 (six) hours as needed for wheezing.   aspirin EC 81 MG tablet Take 81 mg by mouth every morning.    latanoprost 0.005 % ophthalmic solution Commonly known as:  XALATAN Place 1 drop into both eyes at bedtime.   montelukast 10 MG tablet Commonly known as:  SINGULAIR Take 10 mg by mouth daily as needed for allergies.         DISCHARGE INSTRUCTIONS:   DIET:  Regular diet  DISCHARGE CONDITION:  Stable  ACTIVITY:  Activity as tolerated  OXYGEN:  Home Oxygen: No.   Oxygen Delivery: room air  DISCHARGE LOCATION:  home   If you experience worsening of your admission symptoms, develop shortness of breath, life threatening emergency, suicidal or homicidal thoughts you must seek medical attention immediately by calling 911 or calling your MD immediately  if symptoms less severe.  You Must read complete instructions/literature along with all the possible adverse reactions/side effects for all the Medicines you take and that have been prescribed to you. Take any new Medicines after you have completely understood and accpet all the possible adverse reactions/side effects.   Please note  You were cared for by a hospitalist during your hospital stay. If you have any questions about your discharge medications or the care you received while you were in the hospital after you are discharged, you can call the unit and asked to speak with the hospitalist on call if the hospitalist that took care of you is not available. Once you are discharged, your primary care physician will handle any further medical issues. Please note that NO REFILLS for any discharge medications will be authorized once you are discharged, as it is imperative that you return to your primary care physician (  or establish a relationship with a primary care physician if you do not have one) for your aftercare needs so that they can reassess your need for medications and monitor your lab values.     Today   Still has some itching but clinically improved.  LFT's improving.   VITAL SIGNS:  Blood pressure 129/74, pulse (!)  53, temperature 98.1 F (36.7 C), temperature source Oral, resp. rate 16, height 5\' 8"  (1.727 m), weight 99.2 kg (218 lb 11.2 oz), SpO2 97 %.  I/O:   Intake/Output Summary (Last 24 hours) at 07/24/16 1547 Last data filed at 07/24/16 1017  Gross per 24 hour  Intake          2790.49 ml  Output             3475 ml  Net          -684.51 ml    PHYSICAL EXAMINATION:   GENERAL:  65 y.o.-year-old patient sitting up in bed in no acute distress.  EYES: Pupils equal, round, reactive to light and accommodation. + scleral icterus. Extraocular muscles intact.  HEENT: Head atraumatic, normocephalic. Oropharynx and nasopharynx clear.  NECK:  Supple, no jugular venous distention. No thyroid enlargement, no tenderness.  LUNGS: Normal breath sounds bilaterally, no wheezing, rales, rhonchi. No use of accessory muscles of respiration.  CARDIOVASCULAR: S1, S2 normal. No murmurs, rubs, or gallops.  ABDOMEN: Soft, nontender, nondistended. Bowel sounds present. No organomegaly or mass.  EXTREMITIES: No cyanosis, clubbing or edema b/l.    NEUROLOGIC: Cranial nerves II through XII are intact. No focal Motor or sensory deficits b/l.   PSYCHIATRIC: The patient is alert and oriented x 3.  SKIN: No obvious rash, lesion, or ulcer.   DATA REVIEW:   CBC No results for input(s): WBC, HGB, HCT, PLT in the last 168 hours.  Chemistries   Recent Labs Lab 07/24/16 0450  NA 138  K 4.1  CL 108  CO2 26  GLUCOSE 96  BUN 11  CREATININE 0.63  CALCIUM 9.1  AST 144*  ALT 363*  ALKPHOS 176*  BILITOT 9.0*    Cardiac Enzymes No results for input(s): TROPONINI in the last 168 hours.  Microbiology Results  No results found for this or any previous visit.  RADIOLOGY:  No results found.    Management plans discussed with the patient, family and they are in agreement.  CODE STATUS:  Code Status History    Date Active Date Inactive Code Status Order ID Comments User Context   07/22/2016  4:29 PM 07/24/2016   3:33 PM Full Code [id number]  Hillary Bow, MD ED    Advance Directive Documentation   Flowsheet Row Most Recent Value  Type of Advance Directive  Healthcare Power of Attorney  Pre-existing out of facility DNR order (yellow form or pink MOST form)  No data  "MOST" Form in Place?  No data      TOTAL TIME TAKING CARE OF THIS PATIENT: 40 minutes.    Henreitta Leber M.D on 07/24/2016 at 3:47 PM  Between 7am to 6pm - Pager - [phone]  After 6pm go to [email or web address] - Proofreader  [facility]  Office  [phone]  CC: Primary care physician; Kirk Ruths., MD

## 2016-07-24 NOTE — Progress Notes (Signed)
07/24/2016 11:47 AM  BP 129/74 (BP Location: Right Arm)   Pulse (!) 53   Temp 98.1 F (36.7 C) (Oral)   Resp 16   Ht 5\' 8"  (1.727 m)   Wt 99.2 kg (218 lb 11.2 oz)   SpO2 97%   BMI 33.25 kg/m  Patient discharged per MD orders. Discharge instructions reviewed with patient and patient verbalized understanding. IV removed per policy. Discharged ambulatory from unit with belongings in hand.  Almedia Balls, RN

## 2016-07-25 LAB — CERULOPLASMIN: Ceruloplasmin: 31.3 mg/dL — ABNORMAL HIGH (ref 16.0–31.0)

## 2016-07-25 LAB — CMV IGM

## 2016-07-25 LAB — AFP TUMOR MARKER: AFP TUMOR MARKER: 4.4 ng/mL (ref 0.0–8.3)

## 2016-07-25 LAB — MITOCHONDRIAL ANTIBODIES: MITOCHONDRIAL M2 AB, IGG: 7.6 U (ref 0.0–20.0)

## 2016-07-25 LAB — ANTINUCLEAR ANTIBODIES, IFA: ANA Ab, IFA: NEGATIVE

## 2016-07-26 LAB — MITOCHONDRIAL ANTIBODIES: Mitochondrial M2 Ab, IgG: 7.4 Units (ref 0.0–20.0)

## 2016-07-26 LAB — EPSTEIN-BARR VIRUS VCA, IGM: EBV VCA IgM: <36 U/mL (ref 0.0–35.9)

## 2016-07-27 ENCOUNTER — Telehealth: Payer: Self-pay | Admitting: Emergency Medicine

## 2016-07-27 ENCOUNTER — Other Ambulatory Visit: Payer: Self-pay | Admitting: Nurse Practitioner

## 2016-07-27 DIAGNOSIS — B179 Acute viral hepatitis, unspecified: Secondary | ICD-10-CM

## 2016-07-27 DIAGNOSIS — R748 Abnormal levels of other serum enzymes: Secondary | ICD-10-CM

## 2016-07-27 DIAGNOSIS — R17 Unspecified jaundice: Secondary | ICD-10-CM

## 2016-07-27 LAB — ANTINUCLEAR ANTIBODIES, IFA: ANA Ab, IFA: NEGATIVE

## 2016-07-27 NOTE — Telephone Encounter (Signed)
Called patient to discuss follow up for incidental finding of pulmonary nodule.  He says he was aware, as it was mentioned at his gi appt.  He has an appt on Tuesday 9/5 at 915am.

## 2016-07-28 LAB — ANTI-SMOOTH MUSCLE ANTIBODY, IGG: F-ACTIN AB IGG: 12 U (ref 0–19)

## 2016-07-29 ENCOUNTER — Ambulatory Visit
Admission: RE | Admit: 2016-07-29 | Discharge: 2016-07-29 | Disposition: A | Discharge status: Home / Self Care | Payer: BC Managed Care – PPO | Source: Ambulatory Visit | Attending: Nurse Practitioner | Admitting: Nurse Practitioner

## 2016-07-29 ENCOUNTER — Other Ambulatory Visit: Payer: Self-pay | Admitting: Nurse Practitioner

## 2016-07-29 DIAGNOSIS — R17 Unspecified jaundice: Secondary | ICD-10-CM

## 2016-07-29 DIAGNOSIS — R748 Abnormal levels of other serum enzymes: Secondary | ICD-10-CM

## 2016-07-29 DIAGNOSIS — R911 Solitary pulmonary nodule: Secondary | ICD-10-CM | POA: Insufficient documentation

## 2016-07-29 DIAGNOSIS — B179 Acute viral hepatitis, unspecified: Secondary | ICD-10-CM

## 2016-07-29 DIAGNOSIS — K760 Fatty (change of) liver, not elsewhere classified: Secondary | ICD-10-CM | POA: Diagnosis not present

## 2016-07-29 MED ORDER — GADOBENATE DIMEGLUMINE 529 MG/ML IV SOLN
20.0000 mL | Freq: Once | INTRAVENOUS | Status: AC | PRN
  Administered 2016-07-29: 20 mL via INTRAVENOUS

## 2016-08-01 ENCOUNTER — Other Ambulatory Visit: Payer: Self-pay | Admitting: Internal Medicine

## 2016-08-01 DIAGNOSIS — R918 Other nonspecific abnormal finding of lung field: Secondary | ICD-10-CM

## 2016-08-07 ENCOUNTER — Ambulatory Visit: Payer: Self-pay | Admitting: Urology

## 2016-09-12 LAB — CA 19-9 (SERIAL): CA 19 9: 36

## 2016-11-03 ENCOUNTER — Ambulatory Visit
Admission: RE | Admit: 2016-11-03 | Discharge: 2016-11-03 | Disposition: A | Discharge status: Home / Self Care | Payer: BC Managed Care – PPO | Source: Ambulatory Visit | Attending: Internal Medicine | Admitting: Internal Medicine

## 2016-11-03 DIAGNOSIS — I2510 Atherosclerotic heart disease of native coronary artery without angina pectoris: Secondary | ICD-10-CM | POA: Insufficient documentation

## 2016-11-03 DIAGNOSIS — R911 Solitary pulmonary nodule: Secondary | ICD-10-CM | POA: Insufficient documentation

## 2016-11-03 DIAGNOSIS — I700 Atherosclerosis of aorta: Secondary | ICD-10-CM | POA: Insufficient documentation

## 2016-11-03 DIAGNOSIS — R918 Other nonspecific abnormal finding of lung field: Secondary | ICD-10-CM | POA: Insufficient documentation

## 2016-11-09 ENCOUNTER — Other Ambulatory Visit (HOSPITAL_COMMUNITY): Payer: Self-pay | Admitting: Internal Medicine

## 2016-11-09 DIAGNOSIS — R918 Other nonspecific abnormal finding of lung field: Secondary | ICD-10-CM

## 2017-05-24 ENCOUNTER — Other Ambulatory Visit (HOSPITAL_COMMUNITY): Payer: Self-pay | Admitting: Internal Medicine

## 2017-05-24 DIAGNOSIS — R911 Solitary pulmonary nodule: Secondary | ICD-10-CM

## 2017-06-11 ENCOUNTER — Ambulatory Visit: Payer: BC Managed Care – PPO

## 2017-10-01 ENCOUNTER — Ambulatory Visit
Admission: RE | Admit: 2017-10-01 | Discharge: 2017-10-01 | Disposition: A | Discharge status: Home / Self Care | Payer: Medicare HMO | Source: Ambulatory Visit | Attending: Internal Medicine | Admitting: Internal Medicine

## 2017-10-01 DIAGNOSIS — I700 Atherosclerosis of aorta: Secondary | ICD-10-CM | POA: Insufficient documentation

## 2017-10-01 DIAGNOSIS — R911 Solitary pulmonary nodule: Secondary | ICD-10-CM | POA: Insufficient documentation

## 2017-10-01 DIAGNOSIS — R918 Other nonspecific abnormal finding of lung field: Secondary | ICD-10-CM | POA: Diagnosis not present

## 2017-10-04 ENCOUNTER — Other Ambulatory Visit (HOSPITAL_COMMUNITY): Payer: Self-pay | Admitting: Internal Medicine

## 2017-10-04 ENCOUNTER — Other Ambulatory Visit: Payer: Self-pay | Admitting: Internal Medicine

## 2017-10-04 DIAGNOSIS — R911 Solitary pulmonary nodule: Secondary | ICD-10-CM

## 2017-10-10 ENCOUNTER — Other Ambulatory Visit: Payer: BC Managed Care – PPO

## 2018-01-17 ENCOUNTER — Encounter: Payer: Self-pay | Admitting: Student

## 2018-01-18 ENCOUNTER — Ambulatory Visit: Payer: Medicare HMO | Admitting: Anesthesiology

## 2018-01-18 ENCOUNTER — Ambulatory Visit
Admission: RE | Admit: 2018-01-18 | Discharge: 2018-01-18 | Disposition: A | Discharge status: Home / Self Care | Payer: Medicare HMO | Source: Ambulatory Visit | Attending: Unknown Physician Specialty | Admitting: Unknown Physician Specialty

## 2018-01-18 ENCOUNTER — Encounter
Admission: RE | Disposition: A | Discharge status: Home / Self Care | Payer: Self-pay | Source: Ambulatory Visit | Attending: Unknown Physician Specialty

## 2018-01-18 DIAGNOSIS — Z1211 Encounter for screening for malignant neoplasm of colon: Secondary | ICD-10-CM | POA: Insufficient documentation

## 2018-01-18 DIAGNOSIS — K640 First degree hemorrhoids: Secondary | ICD-10-CM | POA: Insufficient documentation

## 2018-01-18 DIAGNOSIS — Z8619 Personal history of other infectious and parasitic diseases: Secondary | ICD-10-CM | POA: Diagnosis not present

## 2018-01-18 DIAGNOSIS — Z86010 Personal history of colonic polyps: Secondary | ICD-10-CM | POA: Diagnosis not present

## 2018-01-18 DIAGNOSIS — M1990 Unspecified osteoarthritis, unspecified site: Secondary | ICD-10-CM | POA: Insufficient documentation

## 2018-01-18 DIAGNOSIS — Z7982 Long term (current) use of aspirin: Secondary | ICD-10-CM | POA: Insufficient documentation

## 2018-01-18 DIAGNOSIS — Z79899 Other long term (current) drug therapy: Secondary | ICD-10-CM | POA: Diagnosis not present

## 2018-01-18 DIAGNOSIS — E7800 Pure hypercholesterolemia, unspecified: Secondary | ICD-10-CM | POA: Diagnosis not present

## 2018-01-18 DIAGNOSIS — N529 Male erectile dysfunction, unspecified: Secondary | ICD-10-CM | POA: Insufficient documentation

## 2018-01-18 DIAGNOSIS — D123 Benign neoplasm of transverse colon: Secondary | ICD-10-CM | POA: Diagnosis not present

## 2018-01-18 DIAGNOSIS — Z8371 Family history of colonic polyps: Secondary | ICD-10-CM | POA: Diagnosis not present

## 2018-01-18 DIAGNOSIS — Z880 Allergy status to penicillin: Secondary | ICD-10-CM | POA: Diagnosis not present

## 2018-01-18 HISTORY — DX: Other nonspecific abnormal finding of lung field: R91.8

## 2018-01-18 HISTORY — DX: Umbilical hernia without obstruction or gangrene: K42.9

## 2018-01-18 HISTORY — DX: Pure hypercholesterolemia, unspecified: E78.00

## 2018-01-18 HISTORY — PX: COLONOSCOPY WITH PROPOFOL: SHX5780

## 2018-01-18 SURGERY — COLONOSCOPY WITH PROPOFOL
Anesthesia: General

## 2018-01-18 MED ORDER — LACTATED RINGERS IV SOLN
INTRAVENOUS | Status: DC | PRN
  Administered 2018-01-18: 10:00:00 via INTRAVENOUS

## 2018-01-18 MED ORDER — PROPOFOL 500 MG/50ML IV EMUL
INTRAVENOUS | Status: DC | PRN
  Administered 2018-01-18: 125 ug/kg/min via INTRAVENOUS

## 2018-01-18 MED ORDER — SODIUM CHLORIDE 0.9 % IV SOLN: INTRAVENOUS | Status: DC

## 2018-01-18 MED ORDER — LIDOCAINE HCL (PF) 1 % IJ SOLN
2.0000 mL | Freq: Once | INTRAMUSCULAR | Status: AC
  Administered 2018-01-18: 0.3 mL via INTRADERMAL

## 2018-01-18 MED ORDER — PROPOFOL 500 MG/50ML IV EMUL
INTRAVENOUS | Status: AC
  Filled 2018-01-18: qty 50

## 2018-01-18 MED ORDER — LIDOCAINE HCL (PF) 1 % IJ SOLN
INTRAMUSCULAR | Status: AC
  Administered 2018-01-18: 0.3 mL via INTRADERMAL
  Filled 2018-01-18: qty 2

## 2018-01-18 MED ORDER — SODIUM CHLORIDE 0.9 % IV SOLN
INTRAVENOUS | Status: DC
  Administered 2018-01-18: 1000 mL via INTRAVENOUS

## 2018-01-18 MED ORDER — PROPOFOL 10 MG/ML IV BOLUS
INTRAVENOUS | Status: DC | PRN
  Administered 2018-01-18 (×2): 50 mg via INTRAVENOUS

## 2018-01-18 NOTE — Anesthesia Postprocedure Evaluation (Signed)
Anesthesia Post Note  Patient: Darrell Burton  Procedure(s) Performed: COLONOSCOPY WITH PROPOFOL (N/A )  Patient location during evaluation: Endoscopy Anesthesia Type: General Level of consciousness: awake and alert Pain management: pain level controlled Vital Signs Assessment: post-procedure vital signs reviewed and stable Respiratory status: spontaneous breathing, nonlabored ventilation, respiratory function stable and patient connected to nasal cannula oxygen Cardiovascular status: blood pressure returned to baseline and stable Postop Assessment: no apparent nausea or vomiting Anesthetic complications: no     Last Vitals:  Vitals:   01/18/18 0948 01/18/18 1033  BP:  107/66  Pulse: (!) 58 (!) 58  Resp: 16 14  Temp: (!) 35.7 C (!) 36.1 C  SpO2: 98% 97%    Last Pain:  Vitals:   01/18/18 1033  TempSrc: Tympanic                 Zynia Wojtowicz S

## 2018-01-18 NOTE — Transfer of Care (Signed)
Immediate Anesthesia Transfer of Care Note  Patient: Darrell Burton  Procedure(s) Performed: COLONOSCOPY WITH PROPOFOL (N/A )  Patient Location: PACU and Endoscopy Unit  Anesthesia Type:General  Level of Consciousness: awake, alert  and oriented  Airway & Oxygen Therapy: Patient Spontanous Breathing  Post-op Assessment: Report given to RN and Post -op Vital signs reviewed and stable  Post vital signs: Reviewed and stable  Last Vitals:  Vitals:   01/18/18 0948 01/18/18 1033  BP:  107/66  Pulse: (!) 58 (!) 58  Resp: 16 14  Temp: (!) 35.7 C (!) 36.1 C  SpO2: 98% 97%    Last Pain:  Vitals:   01/18/18 1033  TempSrc: Tympanic         Complications: No apparent anesthesia complications

## 2018-01-18 NOTE — Anesthesia Preprocedure Evaluation (Signed)
Anesthesia Evaluation  Patient identified by MRN, date of birth, ID band Patient awake    Reviewed: Allergy & Precautions, NPO status , Patient's Chart, lab work & pertinent test results, reviewed documented beta blocker date and time   Airway Mallampati: III  TM Distance: >3 FB     Dental  (+) Chipped   Pulmonary           Cardiovascular      Neuro/Psych    GI/Hepatic (+) Hepatitis -, A  Endo/Other    Renal/GU      Musculoskeletal  (+) Arthritis ,   Abdominal   Peds  Hematology   Anesthesia Other Findings   Reproductive/Obstetrics                             Anesthesia Physical Anesthesia Plan  ASA: III  Anesthesia Plan: General   Post-op Pain Management:    Induction: Intravenous  PONV Risk Score and Plan:   Airway Management Planned:   Additional Equipment:   Intra-op Plan:   Post-operative Plan:   Informed Consent: I have reviewed the patients History and Physical, chart, labs and discussed the procedure including the risks, benefits and alternatives for the proposed anesthesia with the patient or authorized representative who has indicated his/her understanding and acceptance.     Plan Discussed with: CRNA  Anesthesia Plan Comments:         Anesthesia Quick Evaluation

## 2018-01-18 NOTE — Anesthesia Post-op Follow-up Note (Signed)
Anesthesia QCDR form completed.        

## 2018-01-18 NOTE — H&P (Signed)
   Primary Care Physician:  Kirk Ruths, MD Primary Gastroenterologist:  Dr. Vira Agar  Pre-Procedure History & Physical: HPI:  Darrell Burton is a 67 y.o. male is here for an colonoscopy.  This is for [facility] colon polyps.   Past Medical History:  Diagnosis Date  . Erectile dysfunction   . Glaucoma   . Hepatitis A   . Hypercholesteremia   . Hyperlipidemia   . Lung mass   . Osteoarthritis   . Umbilical hernia     Past Surgical History:  Procedure Laterality Date  . APPENDECTOMY    . POLYPECTOMY    . UMBILICAL HERNIA REPAIR      Prior to Admission medications   Medication Sig Start Date End Date Taking? Authorizing Provider  atorvastatin (LIPITOR) 20 MG tablet Take 20 mg by mouth daily.   Yes [provider]  polyethylene glycol powder (GLYCOLAX/MIRALAX) powder Take 0.5 Containers by mouth once.   Yes [provider]  aspirin EC 81 MG tablet Take 81 mg by mouth every morning.    [provider]  latanoprost (XALATAN) 0.005 % ophthalmic solution Place 1 drop into both eyes at bedtime. 06/26/16   [provider]  montelukast (SINGULAIR) 10 MG tablet Take 10 mg by mouth daily as needed for allergies. 06/27/16   [provider]    Allergies as of 11/09/2017  . (No Known Allergies)    Family History  Problem Relation Age of Onset  . Colon polyps Father   . Melanoma Father     Social History   Socioeconomic History  . Marital status: Married    Spouse name: Not on file  . Number of children: Not on file  . Years of education: Not on file  . Highest education level: Not on file  Social Needs  . Financial resource strain: Not on file  . Food insecurity - worry: Not on file  . Food insecurity - inability: Not on file  . Transportation needs - medical: Not on file  . Transportation needs - non-medical: Not on file  Occupational History  . Not on file  Tobacco Use  . Smoking status: Never Smoker  . Smokeless tobacco: Never  Used  Substance and Sexual Activity  . Alcohol use: No  . Drug use: No  . Sexual activity: Not on file  Other Topics Concern  . Not on file  Social History Narrative  . Not on file    Review of Systems: See HPI, otherwise negative ROS  Physical Exam: Pulse (!) 58   Temp (!) 96.3 F (35.7 C) (Tympanic)   Resp 16   Ht 5\' 8"  (1.727 m)   Wt 96.6 kg (213 lb)   SpO2 98%   BMI 32.39 kg/m  General:   Alert,  pleasant and cooperative in NAD Head:  Normocephalic and atraumatic. Neck:  Supple; no masses or thyromegaly. Lungs:  Clear throughout to auscultation.    Heart:  Regular rate and rhythm. Abdomen:  Soft, nontender and nondistended. Normal bowel sounds, without guarding, and without rebound.   Neurologic:  Alert and  oriented x4;  grossly normal neurologically.  Impression/Plan: Darrell Burton is here for an colonoscopy to be performed for Personal history of colon polyps,  Risks, benefits, limitations, and alternatives regarding  colonoscopy have been reviewed with the patient.  Questions have been answered.  All parties agreeable.   Gaylyn Cheers, MD  01/18/2018, 9:57 AM

## 2018-01-18 NOTE — Op Note (Signed)
[facility] Patient Name: Darrell Burton Procedure Date: 01/18/2018 9:47 AM MRN: [id number] Account #: [id number] Date of Birth: [birth year] Admit Type: Outpatient Age: 67 Room: [facility] ENDO ROOM 1 Gender: Male Note Status: Finalized Procedure:            Colonoscopy Indications:          Personal history of colonic polyps Providers:            Manya Silvas, MD Referring MD:         Ocie Cornfield. Ouida Sills MD, MD (Referring MD) Medicines:            Propofol per Anesthesia Complications:        No immediate complications. Procedure:            Pre-Anesthesia Assessment:                       - After reviewing the risks and benefits, the patient                        was deemed in satisfactory condition to undergo the                        procedure.                       After obtaining informed consent, the colonoscope was                        passed under direct vision. Throughout the procedure,                        the patient's blood pressure, pulse, and oxygen                        saturations were monitored continuously. The                        Colonoscope was introduced through the anus and                        advanced to the the cecum, identified by appendiceal                        orifice and ileocecal valve. The colonoscopy was                        performed without difficulty. The patient tolerated the                        procedure well. The quality of the bowel preparation                        was excellent. Findings:      A diminutive polyp was found in the splenic flexure. The polyp was       sessile. The polyp was removed with a jumbo cold forceps. Resection and       retrieval were complete.      A diminutive polyp was found in the transverse colon. The polyp was       sessile. The polyp was removed with a jumbo cold forceps. Resection and  retrieval were complete.      A few small-mouthed diverticula  were found in the sigmoid colon and       descending colon.      Internal hemorrhoids were found during endoscopy. The hemorrhoids were       medium-sized and Grade I (internal hemorrhoids that do not prolapse).      The exam was otherwise without abnormality. Impression:           - One diminutive polyp at the splenic flexure, removed                        with a jumbo cold forceps. Resected and retrieved.                       - One diminutive polyp in the transverse colon, removed                        with a jumbo cold forceps. Resected and retrieved.                       - Diverticulosis in the sigmoid colon and in the                        descending colon.                       - Internal hemorrhoids.                       - The examination was otherwise normal. Recommendation:       - Await pathology results. Manya Silvas, MD 01/18/2018 10:34:04 AM This report has been signed electronically. Number of Addenda: 0 Note Initiated On: 01/18/2018 9:47 AM Scope Withdrawal Time: 0 hours 6 minutes 40 seconds  Total Procedure Duration: 0 hours 15 minutes 12 seconds       [facility]

## 2018-01-21 ENCOUNTER — Encounter: Payer: Self-pay | Admitting: Unknown Physician Specialty

## 2018-01-21 LAB — SURGICAL PATHOLOGY

## 2018-10-07 ENCOUNTER — Ambulatory Visit
Admission: RE | Admit: 2018-10-07 | Discharge: 2018-10-07 | Disposition: A | Discharge status: Home / Self Care | Payer: Medicare HMO | Source: Ambulatory Visit | Attending: Internal Medicine | Admitting: Internal Medicine

## 2018-10-07 DIAGNOSIS — R911 Solitary pulmonary nodule: Secondary | ICD-10-CM | POA: Insufficient documentation

## 2018-10-07 DIAGNOSIS — R918 Other nonspecific abnormal finding of lung field: Secondary | ICD-10-CM | POA: Insufficient documentation

## 2018-10-07 DIAGNOSIS — I2510 Atherosclerotic heart disease of native coronary artery without angina pectoris: Secondary | ICD-10-CM | POA: Insufficient documentation

## 2018-10-07 DIAGNOSIS — I700 Atherosclerosis of aorta: Secondary | ICD-10-CM | POA: Diagnosis not present

## 2019-07-17 IMAGING — CT CT CHEST W/O CM
2 of 3 series · 15 of 36 positions shown, 18 images · non-contrast
Comparison: 10/01/2017, 11/03/2016 and 07/22/2016 CTs

CLINICAL DATA: 66-year-old male for follow-up of pulmonary nodules.

EXAM:
CT CHEST WITHOUT CONTRAST
TECHNIQUE: Multidetector CT imaging of the chest was performed following the
standard protocol without IV contrast.

[Series 2: thorax · axial · 0.72mm/px · z∈[-360,-84]mm · 12 of 163 slices shown, 15 images]
[im 13/163  mediastinal]
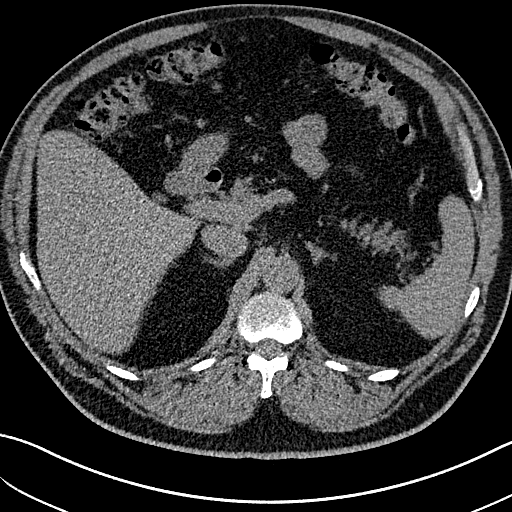
[im 13/163  lung]
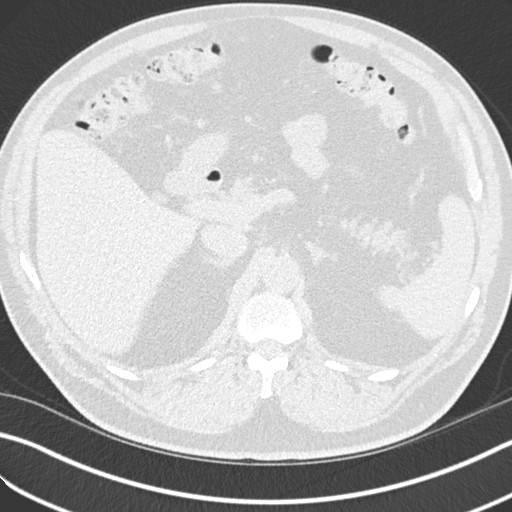
[im 25/163  lung]
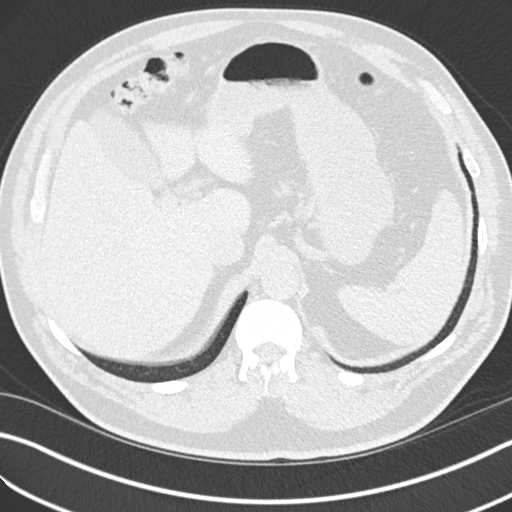
[im 37/163  lung]
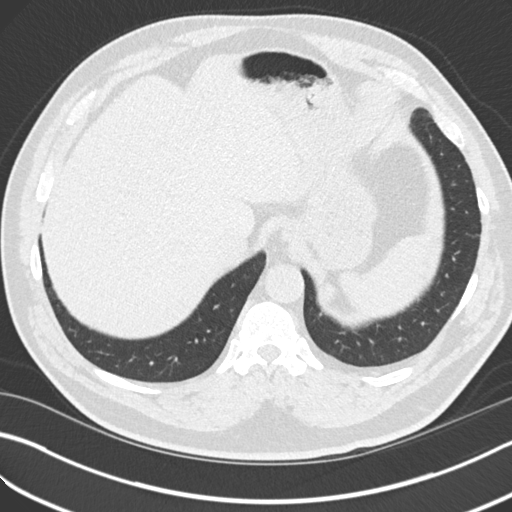
[im 49/163  lung]
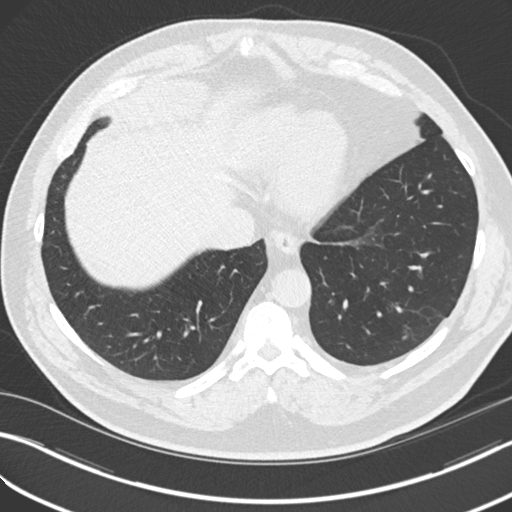
[im 61/163  mediastinal]
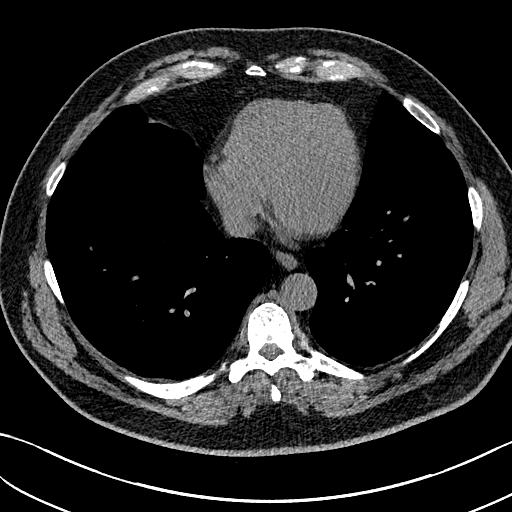
[im 61/163  lung]
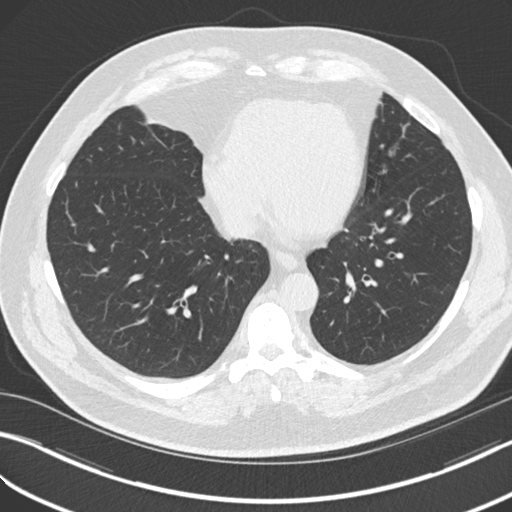
[im 73/163  lung]
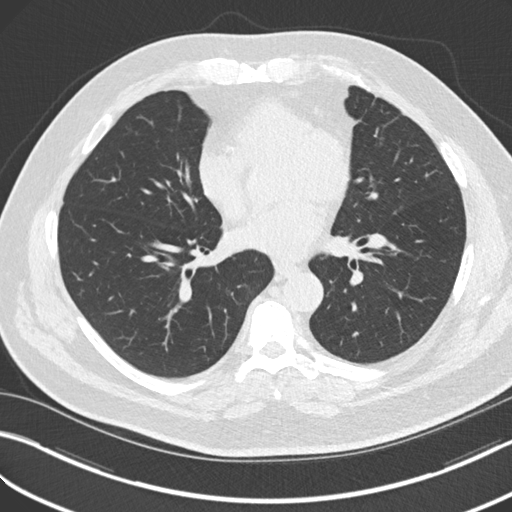
[im 91/163  lung]
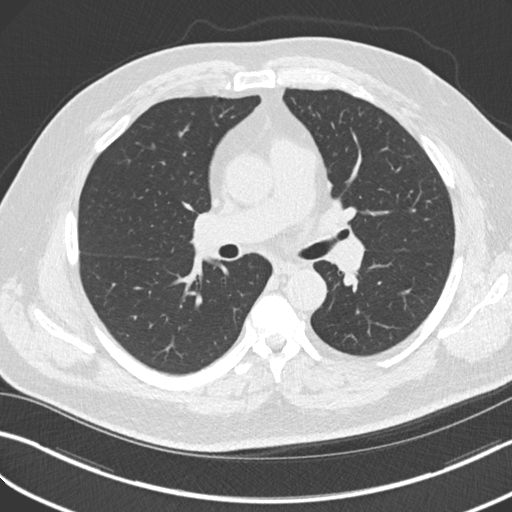
[im 103/163  lung]
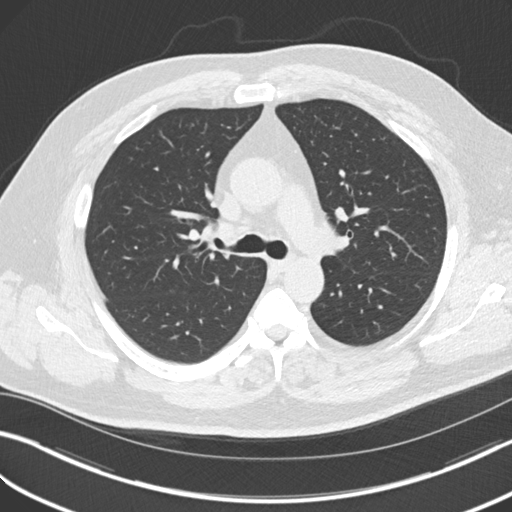
[im 115/163  mediastinal]
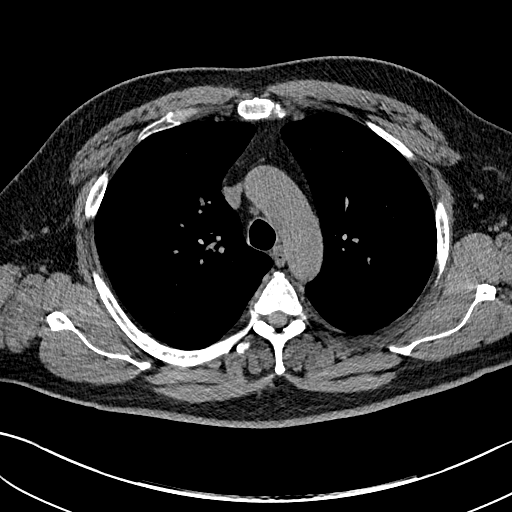
[im 115/163  lung]
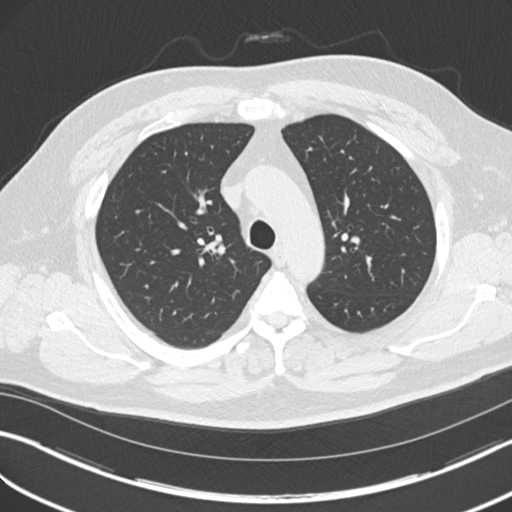
[im 127/163  lung]
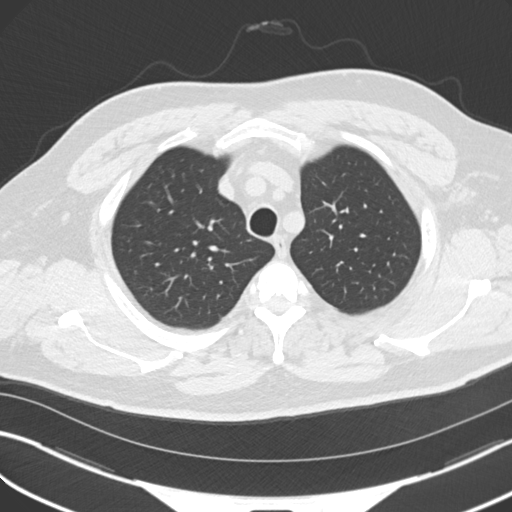
[im 139/163  lung]
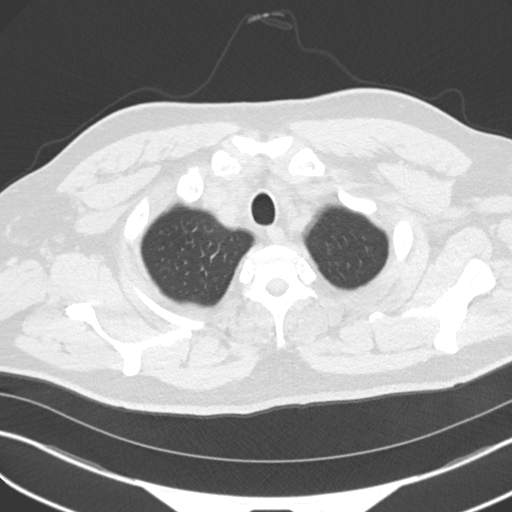
[im 151/163  lung]
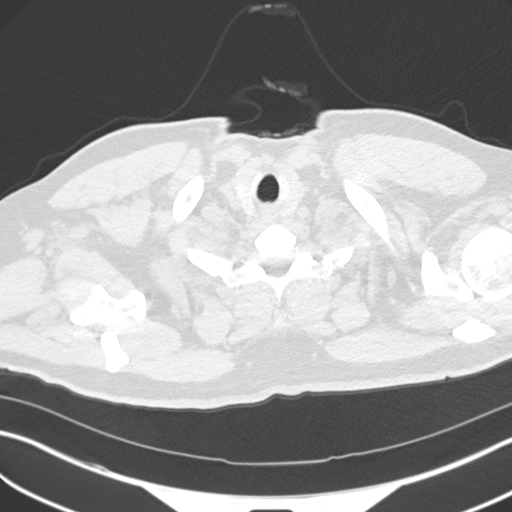

[Series 5: coronal · coronal · 0.68mm/px · 3 of 152 slices shown]
[im 31/152  lung]
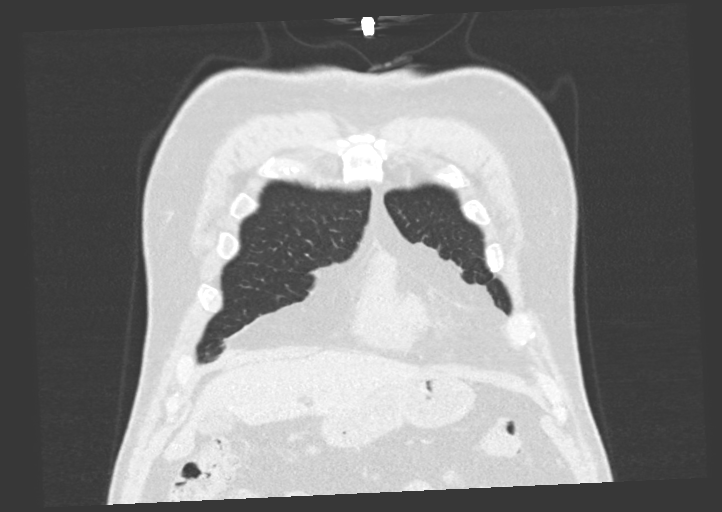
[im 61/152  lung]
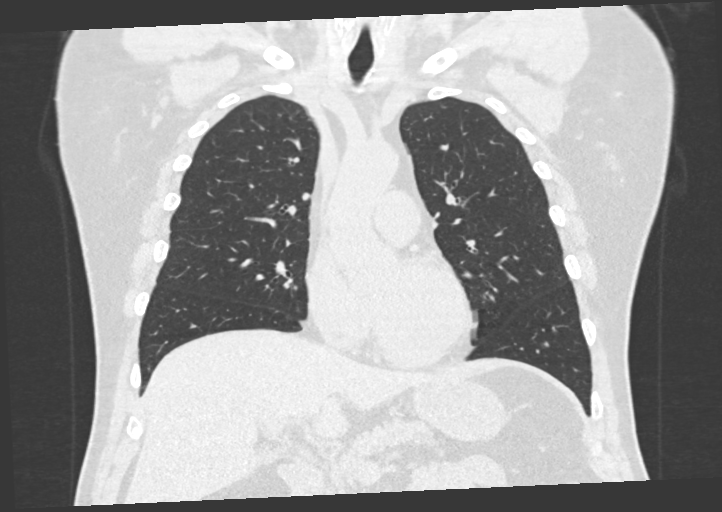
[im 91/152  lung]
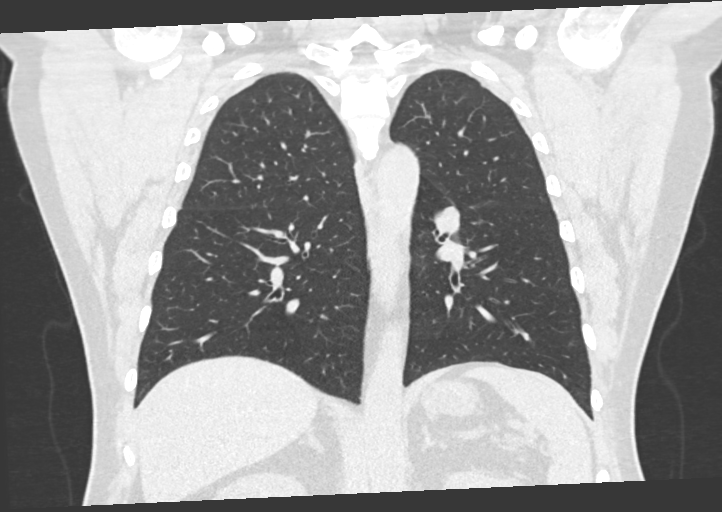

[15 of 36 positions shown; findings below may reference images not displayed]

FINDINGS: Cardiovascular: Heart size is normal. Coronary artery and aortic
atherosclerotic calcifications identified. There is no evidence of
thoracic aortic aneurysm or pericardial effusion.

Mediastinum/Nodes: No enlarged mediastinal or axillary lymph nodes.
Thyroid gland, trachea, and esophagus demonstrate no significant
findings.

Lungs/Pleura:

A stable 0.9 x 1.8 cm LEFT LOWER lobe nodule (series 3: Image 118)
is unchanged since 1805.

A stable 6 mm subpleural LEFT LOWER lobe nodule ([DATE]) is also
unchanged.

No new or enlarging nodules noted.

Mild lingular scarring is again noted.

No airspace disease, consolidation, mass, pleural effusion or
pneumothorax noted.

Upper Abdomen: No acute abnormality

Musculoskeletal: No acute or suspicious bony abnormalities.
IMPRESSION: 1. Stable LEFT LOWER lobe nodules since 1805. Given appearance of
the larger nodule, recommend 1 additional CT follow-up in 1 year.
2. Coronary artery and Aortic Atherosclerosis ([id number]-[id number].L).

## 2019-11-07 ENCOUNTER — Other Ambulatory Visit: Payer: Self-pay | Admitting: Internal Medicine

## 2019-11-07 DIAGNOSIS — R918 Other nonspecific abnormal finding of lung field: Secondary | ICD-10-CM

## 2019-11-14 ENCOUNTER — Ambulatory Visit: Payer: Medicare HMO

## 2019-11-25 ENCOUNTER — Ambulatory Visit
Admission: RE | Admit: 2019-11-25 | Discharge: 2019-11-25 | Disposition: A | Discharge status: Home / Self Care | Payer: Medicare HMO | Source: Ambulatory Visit | Attending: Internal Medicine | Admitting: Internal Medicine

## 2019-11-25 ENCOUNTER — Other Ambulatory Visit: Payer: Self-pay

## 2019-11-25 DIAGNOSIS — R918 Other nonspecific abnormal finding of lung field: Secondary | ICD-10-CM | POA: Diagnosis not present

## 2020-01-16 ENCOUNTER — Ambulatory Visit: Payer: Medicare HMO

## 2020-01-18 ENCOUNTER — Ambulatory Visit: Payer: Medicare HMO | Attending: Internal Medicine

## 2020-01-18 DIAGNOSIS — Z23 Encounter for immunization: Secondary | ICD-10-CM | POA: Insufficient documentation

## 2020-01-18 NOTE — Progress Notes (Signed)
   Covid-19 Vaccination Clinic  Name:  Darrell Burton    MRN: [id number] DOB: 30-Dec-1950  01/18/2020  [name] was observed post Covid-19 immunization for 15 minutes without incidence. He was provided with Vaccine Information Sheet and instruction to access the V-Safe system.   Mr. [name] was instructed to call 911 with any severe reactions post vaccine: Marland Kitchen Difficulty breathing  . Swelling of your face and throat  . A fast heartbeat  . A bad rash all over your body  . Dizziness and weakness    Immunizations Administered    Name Date Dose VIS Date Route   Pfizer COVID-19 Vaccine 01/18/2020  9:42 AM 0.3 mL 11/07/2019 Intramuscular   Manufacturer: [place]   Lot: [id number]   [place]: [id number]

## 2020-02-11 ENCOUNTER — Ambulatory Visit: Payer: Medicare HMO | Attending: Internal Medicine

## 2020-02-11 DIAGNOSIS — Z23 Encounter for immunization: Secondary | ICD-10-CM

## 2020-02-11 NOTE — Progress Notes (Signed)
   Covid-19 Vaccination Clinic  Name:  FOREST CARNAHAN    MRN: [id number] DOB: Mar 16, 1951  02/11/2020  Mr. Holloran was observed post Covid-19 immunization for 15 minutes without incident. He was provided with Vaccine Information Sheet and instruction to access the V-Safe system.   Mr. Yohey was instructed to call 911 with any severe reactions post vaccine: Marland Kitchen Difficulty breathing  . Swelling of face and throat  . A fast heartbeat  . A bad rash all over body  . Dizziness and weakness   Immunizations Administered    Name Date Dose VIS Date Route   Pfizer COVID-19 Vaccine 02/11/2020  3:29 PM 0.3 mL 11/07/2019 Intramuscular   Manufacturer: [place]   Lot: [id number]   [place]: [id number]

## 2020-09-03 IMAGING — CT CT CHEST W/O CM
1 series · 15 of 34 positions shown, 19 images · non-contrast
Comparison: 10/07/2018, 11/03/2016

CLINICAL DATA: Follow-up lung nodule

EXAM:
CT CHEST WITHOUT CONTRAST
TECHNIQUE: Multidetector CT imaging of the chest was performed following the
standard protocol without IV contrast.

[Series 2: thorax · axial · 0.79mm/px · z∈[-584,-304]mm · 15 of 166 slices shown, 19 images]
[im 13/166  mediastinal]
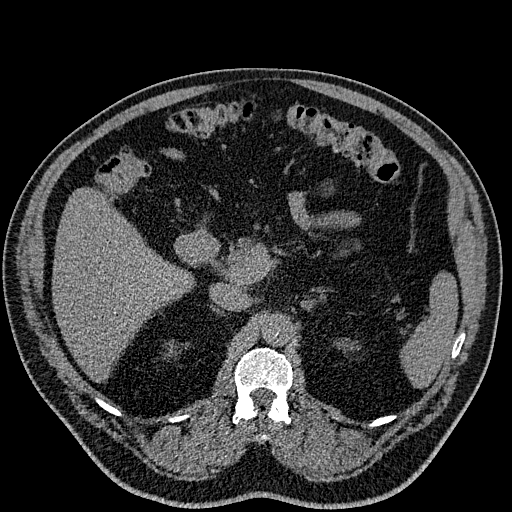
[im 13/166  lung]
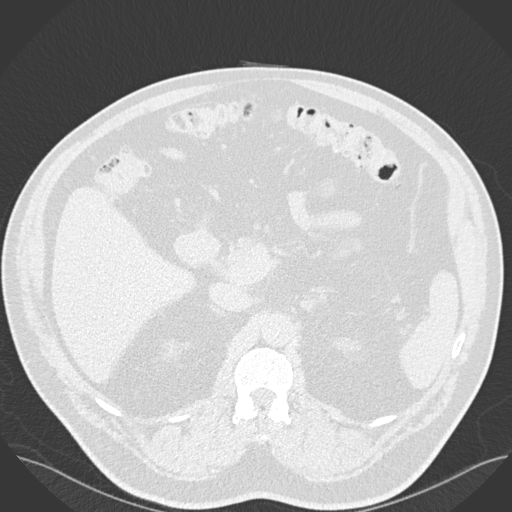
[im 25/166  lung]
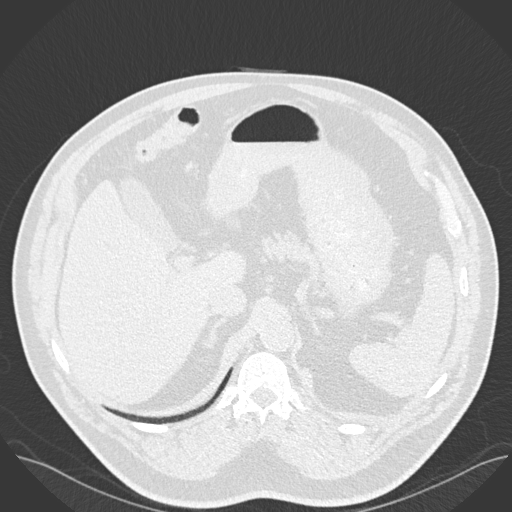
[im 34/166  lung]
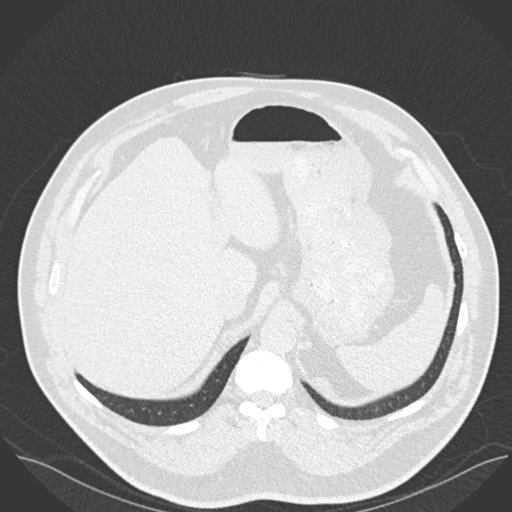
[im 43/166  lung]
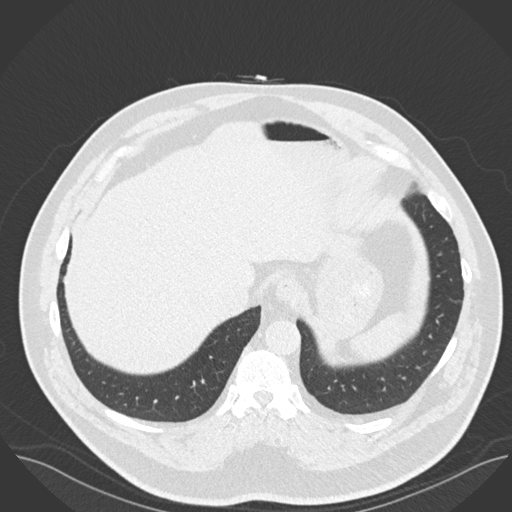
[im 56/166  mediastinal]
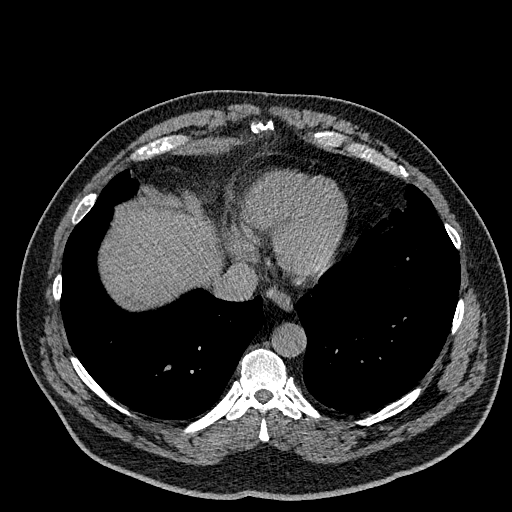
[im 56/166  lung]
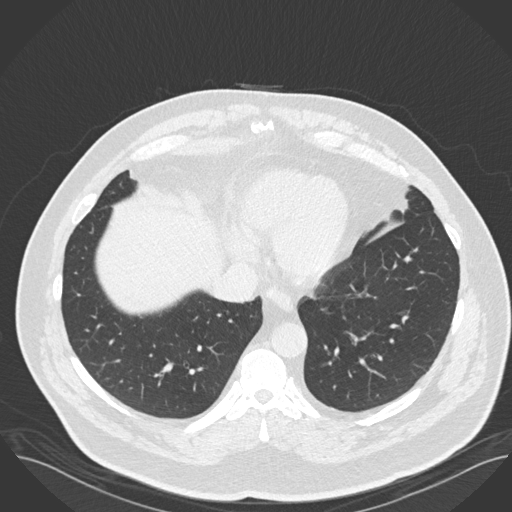
[im 67/166  lung]
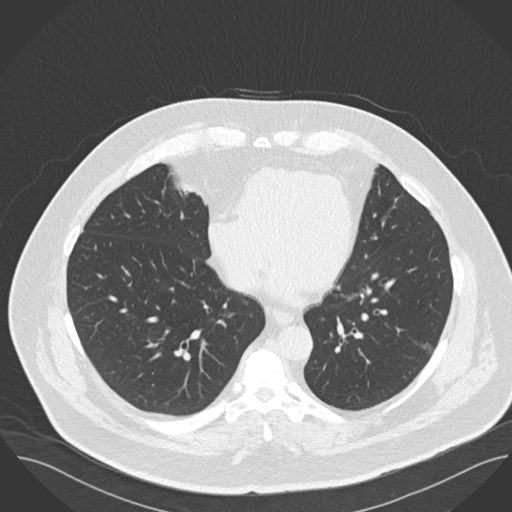
[im 74/166  lung]
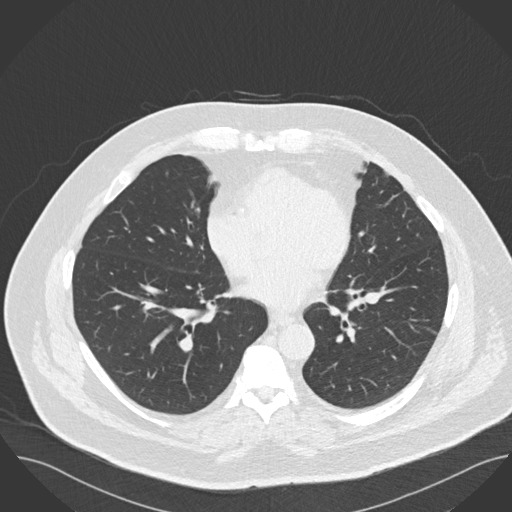
[im 86/166  lung]
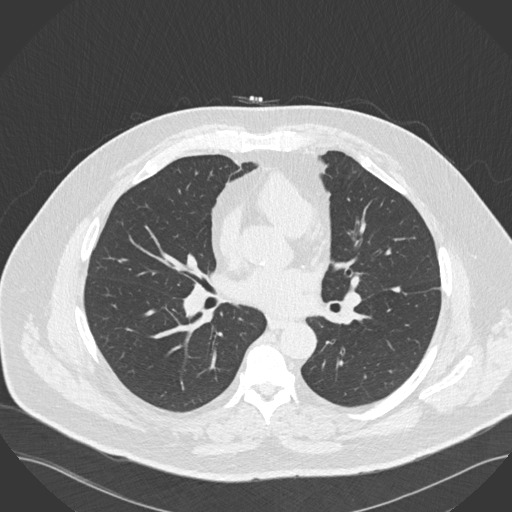
[im 92/166  mediastinal]
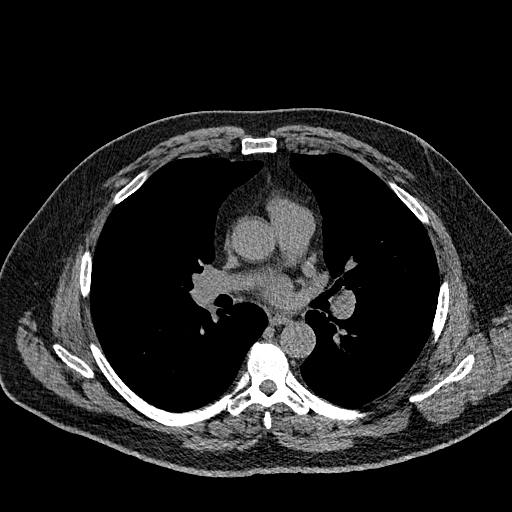
[im 92/166  lung]
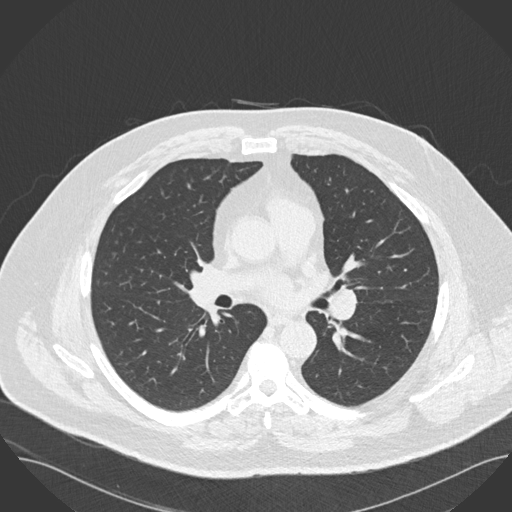
[im 100/166  lung]
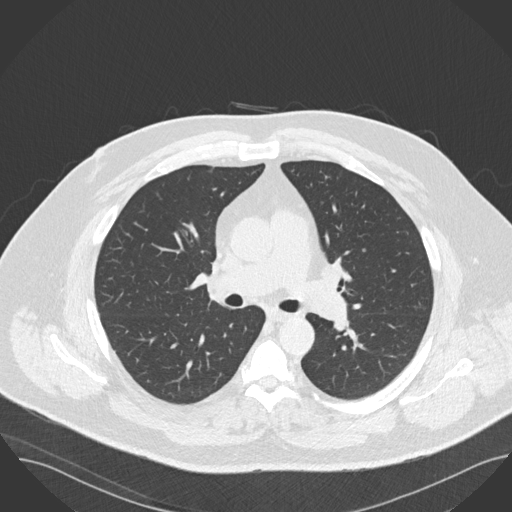
[im 111/166  lung]
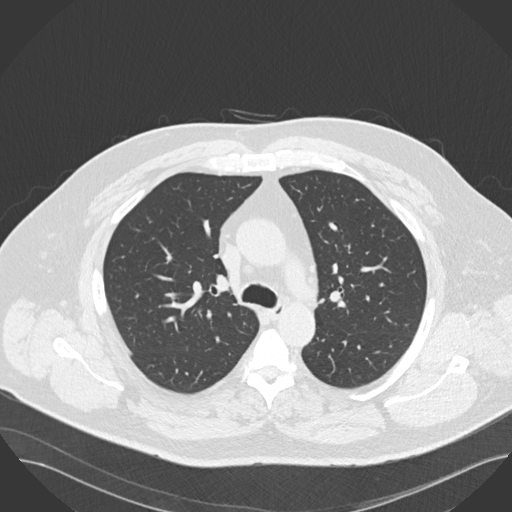
[im 123/166  lung]
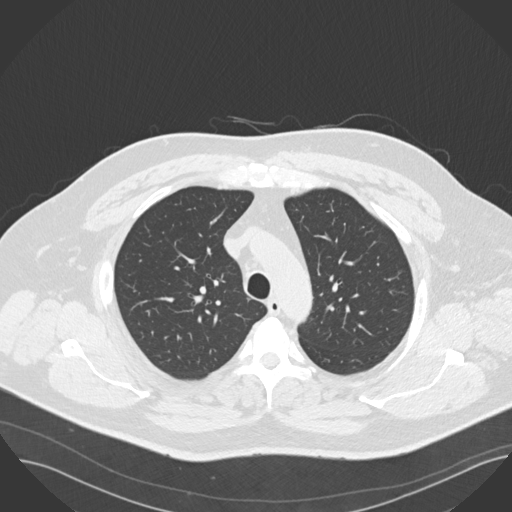
[im 133/166  mediastinal]
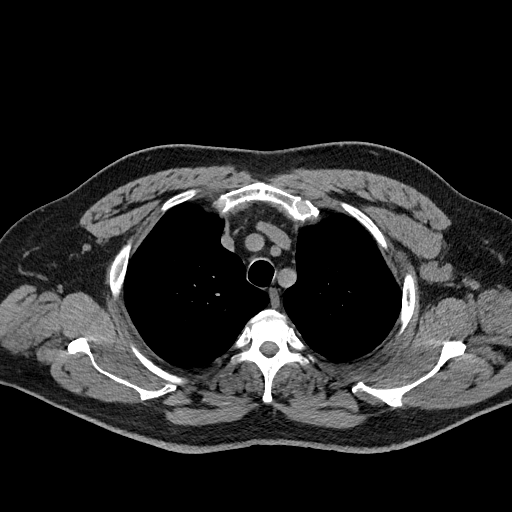
[im 133/166  lung]
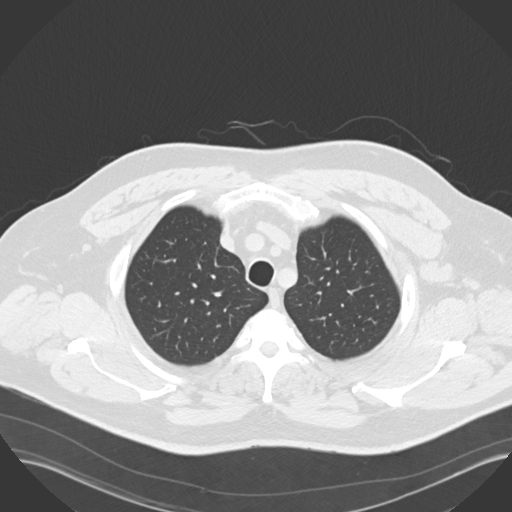
[im 141/166  lung]
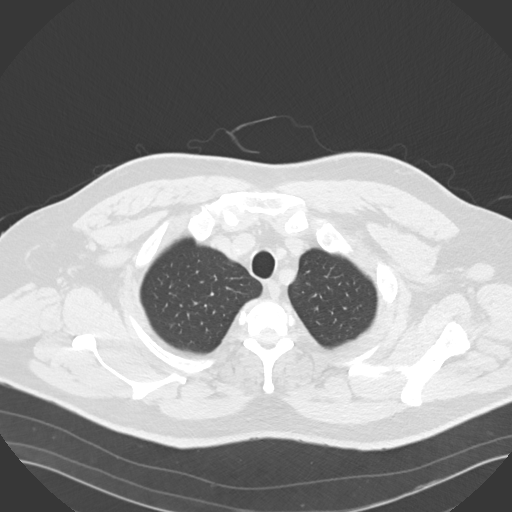
[im 153/166  lung]
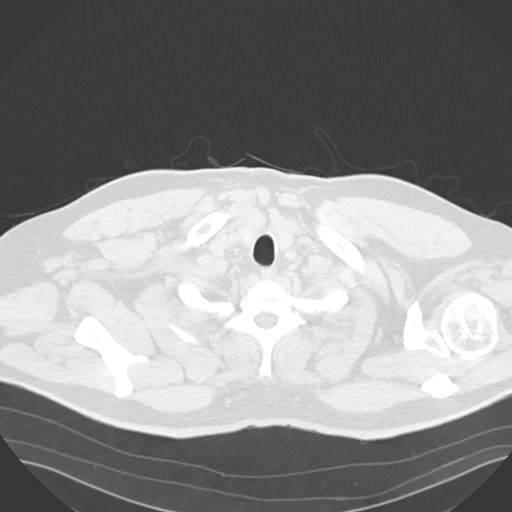

[15 of 34 positions shown; findings below may reference images not displayed]

FINDINGS: Cardiovascular: Coronary artery and aortic calcifications. Heart is
normal size. Aorta is normal caliber.

Mediastinum/Nodes: No mediastinal, hilar, or axillary adenopathy.
Trachea and esophagus are unremarkable. Thyroid unremarkable.

Lungs/Pleura: Left lower lobe nodule on image 116 measures 1.5 cm
compared to 1.8 cm previously on both recent study and study dating
back to 6782. Left lower lobe nodule along the fissure measures 8
mm, also stable dating back to 6782. These have been stable for 3
years and are compatible with benign nodules. Linear scarring in the
lingula. No new or enlarging pulmonary nodules. No effusions.

Upper Abdomen: Imaging into the upper abdomen shows no acute
findings.

Musculoskeletal: Chest wall soft tissues are unremarkable. No acute
bony abnormality.
IMPRESSION: Stable left lower lobe nodules dating back to [AGE] stability
is compatible with benign nodules. No additional follow-up
recommended.

Coronary artery disease.

Aortic Atherosclerosis ([id number].K).

## 2020-12-20 ENCOUNTER — Other Ambulatory Visit: Payer: Self-pay

## 2020-12-20 ENCOUNTER — Ambulatory Visit: Payer: Medicare HMO | Admitting: Dermatology

## 2020-12-20 DIAGNOSIS — L578 Other skin changes due to chronic exposure to nonionizing radiation: Secondary | ICD-10-CM

## 2020-12-20 DIAGNOSIS — Z1283 Encounter for screening for malignant neoplasm of skin: Secondary | ICD-10-CM | POA: Diagnosis not present

## 2020-12-20 DIAGNOSIS — L918 Other hypertrophic disorders of the skin: Secondary | ICD-10-CM | POA: Diagnosis not present

## 2020-12-20 DIAGNOSIS — L821 Other seborrheic keratosis: Secondary | ICD-10-CM

## 2020-12-20 DIAGNOSIS — L814 Other melanin hyperpigmentation: Secondary | ICD-10-CM

## 2020-12-20 DIAGNOSIS — L570 Actinic keratosis: Secondary | ICD-10-CM

## 2020-12-20 DIAGNOSIS — L820 Inflamed seborrheic keratosis: Secondary | ICD-10-CM | POA: Diagnosis not present

## 2020-12-20 DIAGNOSIS — D229 Melanocytic nevi, unspecified: Secondary | ICD-10-CM

## 2020-12-20 DIAGNOSIS — D1800 Hemangioma unspecified site: Secondary | ICD-10-CM

## 2020-12-20 NOTE — Patient Instructions (Signed)

## 2020-12-20 NOTE — Progress Notes (Unsigned)
   Follow-Up Visit   Subjective  Darrell Burton is a 70 y.o. male who presents for the following: Annual Exam (Family history of Melanoma - Father - UBSE today). The patient presents for Upper Body Skin Exam (UBSE) for skin cancer screening and mole check.  The following portions of the chart were reviewed this encounter and updated as appropriate:   Tobacco  Allergies  Meds  Problems  Med Hx  Surg Hx  Fam Hx      Review of Systems:  No other skin or systemic complaints except as noted in HPI or Assessment and Plan.  Objective  Well appearing patient in no apparent distress; mood and affect are within normal limits.  All skin waist up examined.  Objective  Right Forehead: Erythematous thin papules/macules with gritty scale.   Objective  Scalp (4): Erythematous keratotic or waxy stuck-on papule or plaque.    Assessment & Plan    Acrochordons (Skin Tags) - Fleshy, skin-colored pedunculated papules - Benign appearing.  - Observe. - If desired, they can be removed with an in office procedure that is not covered by insurance. - Please call the clinic if you notice any new or changing lesions.  AK (actinic keratosis) Right Forehead  Destruction of lesion - Right Forehead Complexity: simple   Destruction method: cryotherapy   Informed consent: discussed and consent obtained   Timeout:  patient name, date of birth, surgical site, and procedure verified Lesion destroyed using liquid nitrogen: Yes   Region frozen until ice ball extended beyond lesion: Yes   Outcome: patient tolerated procedure well with no complications   Post-procedure details: wound care instructions given    Inflamed seborrheic keratosis (4) Scalp  Destruction of lesion - Scalp Complexity: simple   Destruction method: cryotherapy   Informed consent: discussed and consent obtained   Timeout:  patient name, date of birth, surgical site, and procedure verified Lesion destroyed using liquid  nitrogen: Yes   Region frozen until ice ball extended beyond lesion: Yes   Outcome: patient tolerated procedure well with no complications   Post-procedure details: wound care instructions given    Skin cancer screening   Lentigines - Scattered tan macules - Discussed due to sun exposure - Benign, observe - Call for any changes  Seborrheic Keratoses - Stuck-on, waxy, tan-brown papules and plaques  - Discussed benign etiology and prognosis. - Observe - Call for any changes  Melanocytic Nevi - Tan-brown and/or pink-flesh-colored symmetric macules and papules - Benign appearing on exam today - Observation - Call clinic for new or changing moles - Recommend daily use of broad spectrum spf 30+ sunscreen to sun-exposed areas.   Hemangiomas - Red papules - Discussed benign nature - Observe - Call for any changes  Actinic Damage - Chronic, secondary to cumulative UV/sun exposure - diffuse scaly erythematous macules with underlying dyspigmentation - Recommend daily broad spectrum sunscreen SPF 30+ to sun-exposed areas, reapply every 2 hours as needed.  - Call for new or changing lesions.  Skin cancer screening performed today.  Return in about 1 year (around 12/20/2021) for UBSE.  I, Ashok Cordia, CMA, am acting as scribe for Sarina Ser, MD .  Documentation: I have reviewed the above documentation for accuracy and completeness, and I agree with the above.  Sarina Ser, MD

## 2020-12-21 ENCOUNTER — Encounter: Payer: Self-pay | Admitting: Dermatology

## 2021-12-22 ENCOUNTER — Encounter: Payer: Medicare HMO | Admitting: Dermatology

## 2022-02-27 ENCOUNTER — Ambulatory Visit: Payer: Medicare HMO | Admitting: Dermatology

## 2022-02-27 DIAGNOSIS — L820 Inflamed seborrheic keratosis: Secondary | ICD-10-CM | POA: Diagnosis not present

## 2022-02-27 DIAGNOSIS — D1800 Hemangioma unspecified site: Secondary | ICD-10-CM

## 2022-02-27 DIAGNOSIS — D229 Melanocytic nevi, unspecified: Secondary | ICD-10-CM

## 2022-02-27 DIAGNOSIS — L821 Other seborrheic keratosis: Secondary | ICD-10-CM | POA: Diagnosis not present

## 2022-02-27 DIAGNOSIS — L814 Other melanin hyperpigmentation: Secondary | ICD-10-CM

## 2022-02-27 DIAGNOSIS — Z1283 Encounter for screening for malignant neoplasm of skin: Secondary | ICD-10-CM | POA: Diagnosis not present

## 2022-02-27 DIAGNOSIS — L918 Other hypertrophic disorders of the skin: Secondary | ICD-10-CM

## 2022-02-27 DIAGNOSIS — L578 Other skin changes due to chronic exposure to nonionizing radiation: Secondary | ICD-10-CM

## 2022-02-27 NOTE — Patient Instructions (Addendum)
?Cryotherapy Aftercare ? ?Wash gently with soap and water everyday.   ?Apply Vaseline and Band-Aid daily until healed.  ? ? ? ? ? ?Seborrheic Keratosis ? ?What causes seborrheic keratoses? ?Seborrheic keratoses are harmless, common skin growths that first appear during adult life.  As time goes by, more growths appear.  Some people may develop a large number of them.  Seborrheic keratoses appear on both covered and uncovered body parts.  They are not caused by sunlight.  The tendency to develop seborrheic keratoses can be inherited.  They vary in color from skin-colored to gray, brown, or even black.  They can be either smooth or have a rough, warty surface.   ?Seborrheic keratoses are superficial and look as if they were stuck on the skin.  Under the microscope this type of keratosis looks like layers upon layers of skin.  That is why at times the top layer may seem to fall off, but the rest of the growth remains and re-grows.   ? ?Treatment ?Seborrheic keratoses do not need to be treated, but can easily be removed in the office.  Seborrheic keratoses often cause symptoms when they rub on clothing or jewelry.  Lesions can be in the way of shaving.  If they become inflamed, they can cause itching, soreness, or burning.  Removal of a seborrheic keratosis can be accomplished by freezing, burning, or surgery. ?If any spot bleeds, scabs, or grows rapidly, please return to have it checked, as these can be an indication of a skin cancer. ? ? ? ? ? ? ? ?Melanoma ABCDEs ? ?Melanoma is the most dangerous type of skin cancer, and is the leading cause of death from skin disease.  You are more likely to develop melanoma if you: ?Have light-colored skin, light-colored eyes, or red or blond hair ?Spend a lot of time in the sun ?Tan regularly, either outdoors or in a tanning bed ?Have had blistering sunburns, especially during childhood ?Have a close family member who has had a melanoma ?Have atypical moles or large  birthmarks ? ?Early detection of melanoma is key since treatment is typically straightforward and cure rates are extremely high if we catch it early.  ? ?The first sign of melanoma is often a change in a mole or a new dark spot.  The ABCDE system is a way of remembering the signs of melanoma. ? ?A for asymmetry:  The two halves do not match. ?B for border:  The edges of the growth are irregular. ?C for color:  A mixture of colors are present instead of an even brown color. ?D for diameter:  Melanomas are usually (but not always) greater than 55m - the size of a pencil eraser. ?E for evolution:  The spot keeps changing in size, shape, and color. ? ?Please check your skin once per month between visits. You can use a small mirror in front and a large mirror behind you to keep an eye on the back side or your body.  ? ?If you see any new or changing lesions before your next follow-up, please call to schedule a visit. ? ?Please continue daily skin protection including broad spectrum sunscreen SPF 30+ to sun-exposed areas, reapplying every 2 hours as needed when you're outdoors.  ? ?Staying in the shade or wearing long sleeves, sun glasses (UVA+UVB protection) and wide brim hats (4-inch brim around the entire circumference of the hat) are also recommended for sun protection.   ? ? ?If You Need Anything After Your  Visit ? ?If you have any questions or concerns for your doctor, please call our main line at [phone] and press option 4 to reach your doctor's medical assistant. If no one answers, please leave a voicemail as directed and we will return your call as soon as possible. Messages left after 4 pm will be answered the following business day.  ? ?You may also send [place] a message via MyChart. We typically respond to MyChart messages within 1-2 business days. ? ?For prescription refills, please ask your pharmacy to contact our office. Our fax number is [phone]. ? ?If you have an urgent issue when the clinic is  closed that cannot wait until the next business day, you can page your doctor at the number below.   ? ?Please note that while we do our best to be available for urgent issues outside of office hours, we are not available 24/7.  ? ?If you have an urgent issue and are unable to reach [place], you may choose to seek medical care at your doctor's office, retail clinic, urgent care center, or emergency room. ? ?If you have a medical emergency, please immediately call 911 or go to the emergency department. ? ?Pager Numbers ? ?- Dr. Nehemiah Massed: [phone] ? ?- Dr. Laurence Ferrari: [phone] ? ?- Dr. Nicole Kindred: [phone] ? ?In the event of inclement weather, please call our main line at [phone] for an update on the status of any delays or closures. ? ?Dermatology Medication Tips: ?Please keep the boxes that topical medications come in in order to help keep track of the instructions about where and how to use these. Pharmacies typically print the medication instructions only on the boxes and not directly on the medication tubes.  ? ?If your medication is too expensive, please contact our office at [phone] option 4 or send [place] a message through [place].  ? ?We are unable to tell what your co-pay for medications will be in advance as this is different depending on your insurance coverage. However, we may be able to find a substitute medication at lower cost or fill out paperwork to get insurance to cover a needed medication.  ? ?If a prior authorization is required to get your medication covered by your insurance company, please allow [place] 1-2 business days to complete this process. ? ?Drug prices often vary depending on where the prescription is filled and some pharmacies may offer cheaper prices. ? ?The website www.goodrx.com contains coupons for medications through different pharmacies. The prices here do not account for what the cost may be with help from insurance (it may be cheaper with your insurance), but the website can  give you the price if you did not use any insurance.  ?- You can print the associated coupon and take it with your prescription to the pharmacy.  ?- You may also stop by our office during regular business hours and pick up a GoodRx coupon card.  ?- If you need your prescription sent electronically to a different pharmacy, notify our office through [facility] or by phone at [phone] option 4. ? ? ? ? ?Si Usted Necesita Algo Despu?s de Su Visita ? ?Tambi?n puede enviarnos un mensaje a trav?s de MyChart. Por lo general respondemos a los mensajes de MyChart en el transcurso de 1 a 2 d?as h?biles. ? ?Para renovar recetas, por favor pida a su farmacia que se ponga en contacto con nuestra oficina. Nuestro n?mero de fax es el [phone]. ? ?Si tiene un asunto urgente cuando la  cl?nica est? cerrada y que no puede esperar hasta el siguiente d?a h?bil, puede llamar/localizar a su doctor(a) al n?mero que aparece a continuaci?n.  ? ?Por favor, tenga en cuenta que aunque hacemos todo lo posible para estar disponibles para asuntos urgentes fuera del horario de oficina, no estamos disponibles las 24 horas del d?a, los 7 d?as de la semana.  ? ?Si tiene un problema urgente y no puede comunicarse con nosotros, puede optar por buscar atenci?n m?dica  en el consultorio de su doctor(a), en una cl?nica privada, en un centro de atenci?n urgente o en una sala de emergencias. ? ?Si tiene Engineer, maintenance (IT) m?dica, por favor llame inmediatamente al 911 o vaya a la sala de emergencias. ? ?N?meros de b?per ? ?- Dr. Nehemiah Massed: [phone] ? ?- Dra. Moye: [phone] ? ?- Dra. Nicole Kindred: [phone] ? ?En caso de inclemencias del tiempo, por favor llame a nuestra l?nea principal al [phone] para una actualizaci?n sobre el estado de cualquier retraso o cierre. ? ?Consejos para la medicaci?n en dermatolog?a: ?Por favor, guarde las cajas en las que vienen los medicamentos de uso t?pico para ayudarle a seguir las instrucciones sobre  d?nde y c?mo usarlos. Las farmacias generalmente imprimen las instrucciones del medicamento s?lo en las cajas y no directamente en los tubos del [place].  ? ?Si su medicamento es muy caro, por favor, p?ngase en conta

## 2022-02-27 NOTE — Progress Notes (Signed)
? ?Follow-Up Visit ?  ?Subjective  ?Darrell Burton is a 71 y.o. male who presents for the following: Follow-up (Patient here today for tbse but patient would like to do upper body exam. Patient reports some skin tags under arms , spots at scalp he would like treated and spot on left side of face. ). ?The patient presents for Upper Body Skin Exam (UBSE) for skin cancer screening and mole check.  The patient has spots, moles and lesions to be evaluated, some may be new or changing and the patient has concerns that these could be cancer. ? ?The following portions of the chart were reviewed this encounter and updated as appropriate:  Tobacco  Allergies  Meds  Problems  Med Hx  Surg Hx  Fam Hx   ?  ?Review of Systems: No other skin or systemic complaints except as noted in HPI or Assessment and Plan. ? ?Objective  ?Well appearing patient in no apparent distress; mood and affect are within normal limits. ? ?All skin waist up examined. ? ?posterior scalp x1 , crown scalp x 1 (2) ?Erythematous stuck-on, waxy papule or plaque ? ?Left Axilla x 14 (14) ?Fleshy, skin-colored pedunculated papule ? ?Assessment & Plan  ?Inflamed seborrheic keratosis (2) ?posterior scalp x1 , crown scalp x 1 ?Irritated and bothers patient  ? ?Destruction of lesion - posterior scalp x1 , crown scalp x 1 ?Complexity: simple   ?Destruction method: cryotherapy   ?Informed consent: discussed and consent obtained   ?Timeout:  patient name, date of birth, surgical site, and procedure verified ?Lesion destroyed using liquid nitrogen: Yes   ?Region frozen until ice ball extended beyond lesion: Yes   ?Outcome: patient tolerated procedure well with no complications   ?Post-procedure details: wound care instructions given   ?Additional details:  Prior to procedure, discussed risks of blister formation, small wound, skin dyspigmentation, or rare scar following cryotherapy. Recommend Vaseline ointment to treated areas while healing. ? ?Skin tag  (14) ?Left Axilla x 14 ? ?Patient reports rubbing on clothes and bothering  ?- Benign appearing.  ?- Observe. ?- If desired, they can be removed with an in office procedure that is not covered by insurance. ?- Please call the clinic if you notice any new or changing lesions. ? ?Destruction of lesion - Left Axilla x 14 ?Complexity: simple   ?Destruction method: cryotherapy   ?Informed consent: discussed and consent obtained   ?Timeout:  patient name, date of birth, surgical site, and procedure verified ?Lesion destroyed using liquid nitrogen: Yes   ?Region frozen until ice ball extended beyond lesion: Yes   ?Outcome: patient tolerated procedure well with no complications   ?Post-procedure details: wound care instructions given   ?Additional details:  Prior to procedure, discussed risks of blister formation, small wound, skin dyspigmentation, or rare scar following cryotherapy. Recommend Vaseline ointment to treated areas while healing. ? ?Lentigines ?- Scattered tan macules ?- Due to sun exposure ?- Benign-appearing, observe ?- Recommend daily broad spectrum sunscreen SPF 30+ to sun-exposed areas, reapply every 2 hours as needed. ?- Call for any changes ? ?Acrochordons (Skin Tags) ?- Fleshy, skin-colored pedunculated papules under arms  ?- Benign appearing.  ?- Observe. ?- If desired, they can be removed with an in office procedure that is not covered by insurance. ?- Please call the clinic if you notice any new or changing lesions. ? ?Seborrheic Keratoses ?- Stuck-on, waxy, tan-brown papules and/or plaques  ?- Benign-appearing ?- Discussed benign etiology and prognosis. ?- Observe ?- Call for  any changes ? ?Melanocytic Nevi ?- Tan-brown and/or pink-flesh-colored symmetric macules and papules ?- Benign appearing on exam today ?- Observation ?- Call clinic for new or changing moles ?- Recommend daily use of broad spectrum spf 30+ sunscreen to sun-exposed areas.  ? ?Hemangiomas ?- Red papules ?- Discussed benign  nature ?- Observe ?- Call for any changes ? ?Actinic Damage ?- Chronic condition, secondary to cumulative UV/sun exposure ?- diffuse scaly erythematous macules with underlying dyspigmentation ?- Recommend daily broad spectrum sunscreen SPF 30+ to sun-exposed areas, reapply every 2 hours as needed.  ?- Staying in the shade or wearing long sleeves, sun glasses (UVA+UVB protection) and wide brim hats (4-inch brim around the entire circumference of the hat) are also recommended for sun protection.  ?- Call for new or changing lesions. ? ?Skin cancer screening performed today. ?Return in about 1 year (around 02/28/2023) for TBSE and skin tag removal . ? ?I, Ruthell Rummage, CMA, am acting as scribe for Sarina Ser, MD. ?Documentation: I have reviewed the above documentation for accuracy and completeness, and I agree with the above. ? ?Sarina Ser, MD ? ?

## 2022-02-28 ENCOUNTER — Encounter: Payer: Self-pay | Admitting: Dermatology

## 2022-05-20 ENCOUNTER — Emergency Department
Admission: EM | Admit: 2022-05-20 | Discharge: 2022-05-20 | Disposition: A | Discharge status: Home / Self Care | Payer: Medicare HMO | Attending: Emergency Medicine | Admitting: Emergency Medicine

## 2022-05-20 ENCOUNTER — Other Ambulatory Visit: Payer: Self-pay

## 2022-05-20 ENCOUNTER — Emergency Department: Payer: Medicare HMO

## 2022-05-20 DIAGNOSIS — I493 Ventricular premature depolarization: Secondary | ICD-10-CM

## 2022-05-20 DIAGNOSIS — I480 Paroxysmal atrial fibrillation: Secondary | ICD-10-CM | POA: Diagnosis not present

## 2022-05-20 DIAGNOSIS — R079 Chest pain, unspecified: Secondary | ICD-10-CM | POA: Diagnosis present

## 2022-05-20 LAB — CBC
HCT: 37.9 % — ABNORMAL LOW (ref 39.0–52.0)
Hemoglobin: 13.3 g/dL (ref 13.0–17.0)
MCH: 31.8 pg (ref 26.0–34.0)
MCHC: 35.1 g/dL (ref 30.0–36.0)
MCV: 90.7 fL (ref 80.0–100.0)
Platelets: 171 10*3/uL (ref 150–400)
RBC: 4.18 MIL/uL — ABNORMAL LOW (ref 4.22–5.81)
RDW: 11.7 % (ref 11.5–15.5)
WBC: 6.2 10*3/uL (ref 4.0–10.5)
nRBC: 0 % (ref 0.0–0.2)

## 2022-05-20 LAB — BASIC METABOLIC PANEL
Anion gap: 5 (ref 5–15)
BUN: 14 mg/dL (ref 8–23)
CO2: 25 mmol/L (ref 22–32)
Calcium: 9.2 mg/dL (ref 8.9–10.3)
Chloride: 108 mmol/L (ref 98–111)
Creatinine, Ser: 0.85 mg/dL (ref 0.61–1.24)
GFR, Estimated: >60 mL/min (ref >60)
Glucose, Bld: 126 mg/dL — ABNORMAL HIGH (ref 70–99)
Potassium: 3.6 mmol/L (ref 3.5–5.1)
Sodium: 138 mmol/L (ref 135–145)

## 2022-05-20 LAB — TROPONIN I (HIGH SENSITIVITY): Troponin I (High Sensitivity): 5 ng/L (ref <18)

## 2022-05-20 MED ORDER — ATENOLOL 25 MG PO TABS: 12.5000 mg | ORAL_TABLET | Freq: Every day | ORAL | 0 refills | Status: DC

## 2022-05-20 NOTE — ED Triage Notes (Signed)
Pt to ED via POV from home. Pt reports centralized CP that feels tight. Pt wearing a 14 day Holter monitor. . Pt states he cal feel heart palpitations. Pt also endorses nausea. Pt reports hx afib and PVCs. Pt switched from diltiazem to carvedilol due to bradycardia.

## 2022-05-20 NOTE — ED Notes (Signed)
Provided pt with a ginger ale, denies any other needs at present

## 2023-02-28 ENCOUNTER — Ambulatory Visit: Payer: Medicare HMO | Admitting: Dermatology

## 2023-02-28 ENCOUNTER — Encounter: Payer: Self-pay | Admitting: Dermatology

## 2023-02-28 VITALS — BP 120/70 | HR 50

## 2023-02-28 DIAGNOSIS — D229 Melanocytic nevi, unspecified: Secondary | ICD-10-CM | POA: Diagnosis not present

## 2023-02-28 DIAGNOSIS — L578 Other skin changes due to chronic exposure to nonionizing radiation: Secondary | ICD-10-CM

## 2023-02-28 DIAGNOSIS — L821 Other seborrheic keratosis: Secondary | ICD-10-CM

## 2023-02-28 DIAGNOSIS — L918 Other hypertrophic disorders of the skin: Secondary | ICD-10-CM | POA: Diagnosis not present

## 2023-02-28 DIAGNOSIS — Z1283 Encounter for screening for malignant neoplasm of skin: Secondary | ICD-10-CM | POA: Diagnosis not present

## 2023-02-28 DIAGNOSIS — L814 Other melanin hyperpigmentation: Secondary | ICD-10-CM

## 2023-02-28 DIAGNOSIS — L820 Inflamed seborrheic keratosis: Secondary | ICD-10-CM

## 2023-02-28 DIAGNOSIS — D1801 Hemangioma of skin and subcutaneous tissue: Secondary | ICD-10-CM

## 2023-02-28 NOTE — Progress Notes (Signed)
Follow-Up Visit   Subjective  Darrell Burton is a 72 y.o. male who presents for the following: Skin Cancer Screening and Full Body Skin Exam Irritated spots under right arm  Itchy spots at scalp  The patient presents for Total-Body Skin Exam (TBSE) for skin cancer screening and mole check. The patient has spots, moles and lesions to be evaluated, some may be new or changing and the patient has concerns that these could be cancer.  The following portions of the chart were reviewed this encounter and updated as appropriate: medications, allergies, medical history  Review of Systems:  No other skin or systemic complaints except as noted in HPI or Assessment and Plan.  Objective  Well appearing patient in no apparent distress; mood and affect are within normal limits.  A full examination was performed including scalp, head, eyes, ears, nose, lips, neck, chest, axillae, abdomen, back, buttocks, bilateral upper extremities, bilateral lower extremities, hands, feet, fingers, toes, fingernails, and toenails. All findings within normal limits unless otherwise noted below.   Relevant physical exam findings are noted in the Assessment and Plan.   Assessment & Plan   INFLAMED SEBORRHEIC KERATOSIS Exam: Erythematous keratotic or waxy stuck-on papule or plaque. Symptomatic, irritating, patient would like treated. Benign-appearing.  Call clinic for new or changing lesions.  Prior to procedure, discussed risks of blister formation, small wound, skin dyspigmentation, or rare scar following treatment. Recommend Vaseline ointment to treated areas while healing.  Destruction Procedure Note Destruction method: cryotherapy   Informed consent: discussed and consent obtained   Lesion destroyed using liquid nitrogen: Yes   Outcome: patient tolerated procedure well with no complications   Post-procedure details: wound care instructions given   Locations: scalp x 2, left temple x 1 # of Lesions Treated:  3  Skin tag  Right axilla  Flesh colored papules x 20 Patient reports rubbing on clothes and bothering  - Benign appearing.  - Observe. - Please call the clinic if you notice any new or changing lesions.  Destruction Procedure Note Destruction method: cryotherapy   Informed consent: discussed and consent obtained   Lesion destroyed using liquid nitrogen: Yes   Outcome: patient tolerated procedure well with no complications   Post-procedure details: wound care instructions given   Locations: right axilla x 19 and left axilla x 1  # of Lesions Treated: 20  Prior to procedure, discussed risks of blister formation, small wound, skin dyspigmentation, or rare scar following cryotherapy. Recommend Vaseline ointment to treated areas while healing.  LENTIGINES, SEBORRHEIC KERATOSES, HEMANGIOMAS - Benign normal skin lesions - Benign-appearing - Call for any changes  MELANOCYTIC NEVI - Tan-brown and/or pink-flesh-colored symmetric macules and papules - Benign appearing on exam today - Observation - Call clinic for new or changing moles - Recommend daily use of broad spectrum spf 30+ sunscreen to sun-exposed areas.   ACTINIC DAMAGE - Chronic condition, secondary to cumulative UV/sun exposure - diffuse scaly erythematous macules with underlying dyspigmentation - Recommend daily broad spectrum sunscreen SPF 30+ to sun-exposed areas, reapply every 2 hours as needed.  - Staying in the shade or wearing long sleeves, sun glasses (UVA+UVB protection) and wide brim hats (4-inch brim around the entire circumference of the hat) are also recommended for sun protection.  - Call for new or changing lesions.  SKIN CANCER SCREENING PERFORMED TODAY.   Return in about 1 year (around 02/28/2024) for TBSE.  Garry Heater, CMA, am acting as scribe for Sarina Ser, MD.   Documentation: I  have reviewed the above documentation for accuracy and completeness, and I agree with the above.  Sarina Ser, MD

## 2023-02-28 NOTE — Patient Instructions (Addendum)
Seborrheic Keratosis  What causes seborrheic keratoses? Seborrheic keratoses are harmless, common skin growths that first appear during adult life.  As time goes by, more growths appear.  Some people may develop a large number of them.  Seborrheic keratoses appear on both covered and uncovered body parts.  They are not caused by sunlight.  The tendency to develop seborrheic keratoses can be inherited.  They vary in color from skin-colored to gray, brown, or even black.  They can be either smooth or have a rough, warty surface.   Seborrheic keratoses are superficial and look as if they were stuck on the skin.  Under the microscope this type of keratosis looks like layers upon layers of skin.  That is why at times the top layer may seem to fall off, but the rest of the growth remains and re-grows.    Treatment Seborrheic keratoses do not need to be treated, but can easily be removed in the office.  Seborrheic keratoses often cause symptoms when they rub on clothing or jewelry.  Lesions can be in the way of shaving.  If they become inflamed, they can cause itching, soreness, or burning.  Removal of a seborrheic keratosis can be accomplished by freezing, burning, or surgery. If any spot bleeds, scabs, or grows rapidly, please return to have it checked, as these can be an indication of a skin cancer.  Cryotherapy Aftercare  Wash gently with soap and water everyday.   Apply Vaseline and Band-Aid daily until healed.    Melanoma ABCDEs  Melanoma is the most dangerous type of skin cancer, and is the leading cause of death from skin disease.  You are more likely to develop melanoma if you: Have light-colored skin, light-colored eyes, or red or blond hair Spend a lot of time in the sun Tan regularly, either outdoors or in a tanning bed Have had blistering sunburns, especially during childhood Have a close family member who has had a melanoma Have atypical moles or large birthmarks  Early detection of  melanoma is key since treatment is typically straightforward and cure rates are extremely high if we catch it early.   The first sign of melanoma is often a change in a mole or a new dark spot.  The ABCDE system is a way of remembering the signs of melanoma.  A for asymmetry:  The two halves do not match. B for border:  The edges of the growth are irregular. C for color:  A mixture of colors are present instead of an even brown color. D for diameter:  Melanomas are usually (but not always) greater than 6mm - the size of a pencil eraser. E for evolution:  The spot keeps changing in size, shape, and color.  Please check your skin once per month between visits. You can use a small mirror in front and a large mirror behind you to keep an eye on the back side or your body.   If you see any new or changing lesions before your next follow-up, please call to schedule a visit.  Please continue daily skin protection including broad spectrum sunscreen SPF 30+ to sun-exposed areas, reapplying every 2 hours as needed when you're outdoors.   Staying in the shade or wearing long sleeves, sun glasses (UVA+UVB protection) and wide brim hats (4-inch brim around the entire circumference of the hat) are also recommended for sun protection.    Due to recent changes in healthcare laws, you may see results of your pathology and/or laboratory   studies on MyChart before the doctors have had a chance to review them. We understand that in some cases there may be results that are confusing or concerning to you. Please understand that not all results are received at the same time and often the doctors may need to interpret multiple results in order to provide you with the best plan of care or course of treatment. Therefore, we ask that you please give us 2 business days to thoroughly review all your results before contacting the office for clarification. Should we see a critical lab result, you will be contacted sooner.   If  You Need Anything After Your Visit  If you have any questions or concerns for your doctor, please call our main line at [phone] and press option 4 to reach your doctor's medical assistant. If no one answers, please leave a voicemail as directed and we will return your call as soon as possible. Messages left after 4 pm will be answered the following business day.   You may also send us a message via MyChart. We typically respond to MyChart messages within 1-2 business days.  For prescription refills, please ask your pharmacy to contact our office. Our fax number is [phone].  If you have an urgent issue when the clinic is closed that cannot wait until the next business day, you can page your doctor at the number below.    Please note that while we do our best to be available for urgent issues outside of office hours, we are not available 24/7.   If you have an urgent issue and are unable to reach us, you may choose to seek medical care at your doctor's office, retail clinic, urgent care center, or emergency room.  If you have a medical emergency, please immediately call 911 or go to the emergency department.  Pager Numbers  - Dr. Kowalski: [phone]  - Dr. Moye: [phone]  - Dr. Stewart: [phone]  In the event of inclement weather, please call our main line at [phone] for an update on the status of any delays or closures.  Dermatology Medication Tips: Please keep the boxes that topical medications come in in order to help keep track of the instructions about where and how to use these. Pharmacies typically print the medication instructions only on the boxes and not directly on the medication tubes.   If your medication is too expensive, please contact our office at [phone] option 4 or send us a message through MyChart.   We are unable to tell what your co-pay for medications will be in advance as this is different depending on your insurance coverage.  However, we may be able to find a substitute medication at lower cost or fill out paperwork to get insurance to cover a needed medication.   If a prior authorization is required to get your medication covered by your insurance company, please allow us 1-2 business days to complete this process.  Drug prices often vary depending on where the prescription is filled and some pharmacies may offer cheaper prices.  The website www.goodrx.com contains coupons for medications through different pharmacies. The prices here do not account for what the cost may be with help from insurance (it may be cheaper with your insurance), but the website can give you the price if you did not use any insurance.  - You can print the associated coupon and take it with your prescription to the pharmacy.  - You may also stop by our office during   regular business hours and pick up a GoodRx coupon card.  - If you need your prescription sent electronically to a different pharmacy, notify our office through [facility] MyChart or by phone at [phone] option 4.     Si Usted Necesita Algo Despus de Su Visita  Tambin puede enviarnos un mensaje a travs de MyChart. Por lo general respondemos a los mensajes de MyChart en el transcurso de 1 a 2 das hbiles.  Para renovar recetas, por favor pida a su farmacia que se ponga en contacto con nuestra oficina. Nuestro nmero de fax es el [phone].  Si tiene un asunto urgente cuando la clnica est cerrada y que no puede esperar hasta el siguiente da hbil, puede llamar/localizar a su doctor(a) al nmero que aparece a continuacin.   Por favor, tenga en cuenta que aunque hacemos todo lo posible para estar disponibles para asuntos urgentes fuera del horario de oficina, no estamos disponibles las 24 horas del da, los 7 das de la semana.   Si tiene un problema urgente y no puede comunicarse con nosotros, puede optar por buscar atencin mdica  en el consultorio de su  doctor(a), en una clnica privada, en un centro de atencin urgente o en una sala de emergencias.  Si tiene una emergencia mdica, por favor llame inmediatamente al 911 o vaya a la sala de emergencias.  Nmeros de bper  - Dr. Kowalski: [phone]  - Dra. Moye: [phone]  - Dra. Stewart: [phone]  En caso de inclemencias del tiempo, por favor llame a nuestra lnea principal al [phone] para una actualizacin sobre el estado de cualquier retraso o cierre.  Consejos para la medicacin en dermatologa: Por favor, guarde las cajas en las que vienen los medicamentos de uso tpico para ayudarle a seguir las instrucciones sobre dnde y cmo usarlos. Las farmacias generalmente imprimen las instrucciones del medicamento slo en las cajas y no directamente en los tubos del medicamento.   Si su medicamento es muy caro, por favor, pngase en contacto con nuestra oficina llamando al [phone] y presione la opcin 4 o envenos un mensaje a travs de MyChart.   No podemos decirle cul ser su copago por los medicamentos por adelantado ya que esto es diferente dependiendo de la cobertura de su seguro. Sin embargo, es posible que podamos encontrar un medicamento sustituto a menor costo o llenar un formulario para que el seguro cubra el medicamento que se considera necesario.   Si se requiere una autorizacin previa para que su compaa de seguros cubra su medicamento, por favor permtanos de 1 a 2 das hbiles para completar este proceso.  Los precios de los medicamentos varan con frecuencia dependiendo del lugar de dnde se surte la receta y alguna farmacias pueden ofrecer precios ms baratos.  El sitio web www.goodrx.com tiene cupones para medicamentos de diferentes farmacias. Los precios aqu no tienen en cuenta lo que podra costar con la ayuda del seguro (puede ser ms barato con su seguro), pero el sitio web puede darle el precio si no utiliz ningn seguro.  - Puede imprimir el cupn  correspondiente y llevarlo con su receta a la farmacia.  - Tambin puede pasar por nuestra oficina durante el horario de atencin regular y recoger una tarjeta de cupones de GoodRx.  - Si necesita que su receta se enve electrnicamente a una farmacia diferente, informe a nuestra oficina a travs de MyChart de [place] o por telfono llamando al [phone] y presione la opcin 4.   

## 2023-07-18 ENCOUNTER — Other Ambulatory Visit: Payer: Self-pay | Admitting: Nurse Practitioner

## 2023-07-18 DIAGNOSIS — K760 Fatty (change of) liver, not elsewhere classified: Secondary | ICD-10-CM

## 2023-07-18 DIAGNOSIS — R17 Unspecified jaundice: Secondary | ICD-10-CM

## 2023-07-24 ENCOUNTER — Ambulatory Visit
Admission: RE | Admit: 2023-07-24 | Discharge: 2023-07-24 | Disposition: A | Discharge status: Home / Self Care | Payer: Medicare HMO | Source: Ambulatory Visit | Attending: Nurse Practitioner | Admitting: Nurse Practitioner

## 2023-07-24 DIAGNOSIS — K760 Fatty (change of) liver, not elsewhere classified: Secondary | ICD-10-CM | POA: Diagnosis present

## 2023-07-24 DIAGNOSIS — R17 Unspecified jaundice: Secondary | ICD-10-CM | POA: Insufficient documentation

## 2023-09-21 ENCOUNTER — Encounter: Payer: Self-pay | Admitting: Internal Medicine

## 2023-09-24 ENCOUNTER — Encounter: Payer: Self-pay | Admitting: Internal Medicine

## 2023-09-25 ENCOUNTER — Encounter: Payer: Self-pay | Admitting: Internal Medicine

## 2023-10-01 ENCOUNTER — Encounter: Payer: Self-pay | Admitting: Internal Medicine

## 2023-10-02 ENCOUNTER — Ambulatory Visit: Payer: Medicare HMO | Admitting: Anesthesiology

## 2023-10-02 ENCOUNTER — Encounter: Payer: Self-pay | Admitting: Internal Medicine

## 2023-10-02 ENCOUNTER — Ambulatory Visit
Admission: RE | Admit: 2023-10-02 | Discharge: 2023-10-02 | Disposition: A | Discharge status: Home / Self Care | Payer: Medicare HMO | Attending: Internal Medicine | Admitting: Internal Medicine

## 2023-10-02 ENCOUNTER — Encounter
Admission: RE | Disposition: A | Discharge status: Home / Self Care | Payer: Self-pay | Source: Home / Self Care | Attending: Internal Medicine

## 2023-10-02 ENCOUNTER — Other Ambulatory Visit: Payer: Self-pay

## 2023-10-02 DIAGNOSIS — K5730 Diverticulosis of large intestine without perforation or abscess without bleeding: Secondary | ICD-10-CM | POA: Diagnosis not present

## 2023-10-02 DIAGNOSIS — Z09 Encounter for follow-up examination after completed treatment for conditions other than malignant neoplasm: Secondary | ICD-10-CM | POA: Diagnosis not present

## 2023-10-02 DIAGNOSIS — G4730 Sleep apnea, unspecified: Secondary | ICD-10-CM | POA: Diagnosis not present

## 2023-10-02 DIAGNOSIS — Z1211 Encounter for screening for malignant neoplasm of colon: Secondary | ICD-10-CM | POA: Insufficient documentation

## 2023-10-02 DIAGNOSIS — M1990 Unspecified osteoarthritis, unspecified site: Secondary | ICD-10-CM | POA: Insufficient documentation

## 2023-10-02 DIAGNOSIS — I4891 Unspecified atrial fibrillation: Secondary | ICD-10-CM | POA: Diagnosis not present

## 2023-10-02 DIAGNOSIS — K642 Third degree hemorrhoids: Secondary | ICD-10-CM | POA: Insufficient documentation

## 2023-10-02 DIAGNOSIS — K760 Fatty (change of) liver, not elsewhere classified: Secondary | ICD-10-CM | POA: Insufficient documentation

## 2023-10-02 DIAGNOSIS — E7800 Pure hypercholesterolemia, unspecified: Secondary | ICD-10-CM | POA: Insufficient documentation

## 2023-10-02 DIAGNOSIS — Z860101 Personal history of adenomatous and serrated colon polyps: Secondary | ICD-10-CM | POA: Diagnosis not present

## 2023-10-02 HISTORY — DX: Fatty (change of) liver, not elsewhere classified: K76.0

## 2023-10-02 HISTORY — PX: COLONOSCOPY WITH PROPOFOL: SHX5780

## 2023-10-02 HISTORY — DX: Unspecified hemorrhoids: K64.9

## 2023-10-02 HISTORY — DX: Atherosclerosis of aorta: I70.0

## 2023-10-02 HISTORY — DX: Sleep apnea, unspecified: G47.30

## 2023-10-02 HISTORY — DX: Unspecified atrial fibrillation: I48.91

## 2023-10-02 HISTORY — DX: Cardiac arrhythmia, unspecified: I49.9

## 2023-10-02 SURGERY — COLONOSCOPY WITH PROPOFOL
Anesthesia: General

## 2023-10-02 MED ORDER — LIDOCAINE HCL (PF) 2 % IJ SOLN
INTRAMUSCULAR | Status: AC
  Filled 2023-10-02: qty 5

## 2023-10-02 MED ORDER — STERILE WATER FOR IRRIGATION IR SOLN
Status: DC | PRN
  Administered 2023-10-02: 180 mL

## 2023-10-02 MED ORDER — SODIUM CHLORIDE 0.9 % IV SOLN: INTRAVENOUS | Status: DC

## 2023-10-02 MED ORDER — LIDOCAINE HCL (CARDIAC) PF 100 MG/5ML IV SOSY
PREFILLED_SYRINGE | INTRAVENOUS | Status: DC | PRN
  Administered 2023-10-02: 50 mg via INTRAVENOUS

## 2023-10-02 MED ORDER — PROPOFOL 10 MG/ML IV BOLUS
INTRAVENOUS | Status: DC | PRN
  Administered 2023-10-02: 150 ug/kg/min via INTRAVENOUS
  Administered 2023-10-02: 50 mg via INTRAVENOUS

## 2023-10-02 MED ORDER — PROPOFOL 1000 MG/100ML IV EMUL
INTRAVENOUS | Status: AC
  Filled 2023-10-02: qty 100

## 2023-10-02 NOTE — Transfer of Care (Signed)
Immediate Anesthesia Transfer of Care Note  Patient: Darrell Burton  Procedure(s) Performed: COLONOSCOPY WITH PROPOFOL  Patient Location: PACU  Anesthesia Type:General  Level of Consciousness: sedated and drowsy  Airway & Oxygen Therapy: Patient Spontanous Breathing  Post-op Assessment: Report given to RN and Post -op Vital signs reviewed and stable  Post vital signs: Reviewed and stable  Last Vitals:  Vitals Value Taken Time  BP 94/62 10/02/23 0849  Temp 35.9 C 10/02/23 0848  Pulse 56 10/02/23 0849  Resp 20 10/02/23 0849  SpO2 94 % 10/02/23 0849  Vitals shown include unfiled device data.  Last Pain:  Vitals:   10/02/23 0802  TempSrc: Temporal  PainSc: 0-No pain         Complications: No notable events documented.

## 2023-10-02 NOTE — Op Note (Signed)
[facility] Gastroenterology Patient Name: Darrell Burton Procedure Date: 10/02/2023 7:41 AM MRN: [id number] Account #: [id number] Date of Birth: September 27, 1951 Admit Type: Outpatient Age: 72 Room: [facility] ENDO ROOM 4 Gender: Male Note Status: Finalized Instrument Name: Prentice Docker [id number] Procedure:             Colonoscopy Indications:           High risk colon cancer surveillance: Personal history                         of non-advanced adenoma, High risk colon cancer                         surveillance: Personal history of adenoma less than 10                         mm in size Providers:             Boykin Nearing. Norma Fredrickson MD, MD Referring MD:          Marya Amsler. Dareen Piano MD, MD (Referring MD) Medicines:             Propofol per Anesthesia Complications:         No immediate complications. Estimated blood loss: None. Procedure:             Pre-Anesthesia Assessment:                        - The risks and benefits of the procedure and the                         sedation options and risks were discussed with the                         patient. All questions were answered and informed                         consent was obtained.                        - Patient identification and proposed procedure were                         verified prior to the procedure by the nurse. The                         procedure was verified in the procedure room.                        - ASA Grade Assessment: III - A patient with severe                         systemic disease.                        - After reviewing the risks and benefits, the patient                         was deemed in satisfactory condition to undergo the  procedure.                        After obtaining informed consent, the colonoscope was                         passed under direct vision. Throughout the procedure,                         the patient's blood pressure, pulse, and oxygen                          saturations were monitored continuously. The                         Colonoscope was introduced through the anus and                         advanced to the the cecum, identified by appendiceal                         orifice and ileocecal valve. The colonoscopy was                         performed without difficulty. The patient tolerated                         the procedure well. The quality of the bowel                         preparation was good. The ileocecal valve, appendiceal                         orifice, and rectum were photographed. Findings:      The perianal exam findings include internal hemorrhoids that prolapse       with straining, but require manual replacement into the anal canal       (Grade III).      Non-bleeding internal hemorrhoids were found during retroflexion. The       hemorrhoids were Grade III (internal hemorrhoids that prolapse but       require manual reduction).      Many small-mouthed diverticula were found in the sigmoid colon.      The exam was otherwise without abnormality. Impression:            - Internal hemorrhoids that prolapse with straining,                         but require manual replacement into the anal canal                         (Grade III) found on perianal exam.                        - Non-bleeding internal hemorrhoids.                        - Diverticulosis in the sigmoid colon.                        -  The examination was otherwise normal.                        - No specimens collected. Recommendation:        - Patient has a contact number available for                         emergencies. The signs and symptoms of potential                         delayed complications were discussed with the patient.                         Return to normal activities tomorrow. Written                         discharge instructions were provided to the patient.                        - Resume previous diet.                         - Continue present medications.                        - No repeat colonoscopy due to current age (32 years                         or older).                        - You do NOT require further colon cancer screening                         measures (Annual stool testing (i.e. hemoccult, FIT,                         cologuard), sigmoidoscopy, colonoscopy or CT                         colonography). You should share this recommendation                         with your Primary Care provider.                        - Schedule hemorrhoidal banding at my office at next                         available appointment.                        - Offered to patient to schedule at his convenience.                        - Otherwise return to my office as needed.                        - The findings and recommendations were discussed with  the patient. Procedure Code(s):     --- Professional ---                        Z6109, Colorectal cancer screening; colonoscopy on                         individual at high risk Diagnosis Code(s):     --- Professional ---                        K57.30, Diverticulosis of large intestine without                         perforation or abscess without bleeding                        Z86.010, Personal history of colonic polyps                        K64.2, Third degree hemorrhoids CPT copyright 2022 American Medical Association. All rights reserved. The codes documented in this report are preliminary and upon coder review may  be revised to meet current compliance requirements. Stanton Kidney MD, MD 10/02/2023 8:47:59 AM This report has been signed electronically. Number of Addenda: 0 Note Initiated On: 10/02/2023 7:41 AM Scope Withdrawal Time: 0 hours 6 minutes 0 seconds  Total Procedure Duration: 0 hours 9 minutes 18 seconds  Estimated Blood Loss:  Estimated blood loss: none.      [facility]

## 2023-10-02 NOTE — Anesthesia Procedure Notes (Signed)
Date/Time: 10/02/2023 8:34 AM  Performed by: Ginger Carne, CRNAPre-anesthesia Checklist: Patient identified, Emergency Drugs available, Suction available, Patient being monitored and Timeout performed Patient Re-evaluated:Patient Re-evaluated prior to induction Oxygen Delivery Method: Nasal cannula Preoxygenation: Pre-oxygenation with 100% oxygen Induction Type: IV induction

## 2023-10-02 NOTE — Interval H&P Note (Signed)
History and Physical Interval Note:  10/02/2023 8:18 AM  Darrell Burton  has presented today for surgery, with the diagnosis of V12.72 (ICD-9-CM) - Z86.010 (ICD-10-CM) - History of colonic polyps.  The various methods of treatment have been discussed with the patient and family. After consideration of risks, benefits and other options for treatment, the patient has consented to  Procedure(s): COLONOSCOPY WITH PROPOFOL (N/A) as a surgical intervention.  The patient's history has been reviewed, patient examined, no change in status, stable for surgery.  I have reviewed the patient's chart and labs.  Questions were answered to the patient's satisfaction.     [name], [place]

## 2023-10-02 NOTE — Anesthesia Preprocedure Evaluation (Addendum)
Anesthesia Evaluation  Patient identified by MRN, date of birth, ID band Patient awake    Reviewed: Allergy & Precautions, H&P , NPO status , Patient's Chart, lab work & pertinent test results  Airway Mallampati: II  TM Distance: >3 FB Neck ROM: full    Dental no notable dental hx.    Pulmonary sleep apnea    Pulmonary exam normal        Cardiovascular + dysrhythmias (pAF, one episode)  Rhythm:Regular Rate:Normal - Systolic murmurs and - Peripheral Edema    Neuro/Psych negative neurological ROS  negative psych ROS   GI/Hepatic negative GI ROS, Neg liver ROS,,,  Endo/Other  negative endocrine ROS    Renal/GU negative Renal ROS  negative genitourinary   Musculoskeletal   Abdominal  (+) + obese  Peds  Hematology negative hematology ROS (+)   Anesthesia Other Findings Past Medical History: No date: A-fib (HCC) No date: Aortic atherosclerosis (HCC) No date: Dysrhythmia No date: Erectile dysfunction No date: Glaucoma No date: Hemorrhoids No date: Hepatitis A No date: Hypercholesteremia No date: Hyperlipidemia No date: Lung mass No date: Non-alcoholic fatty liver disease No date: Osteoarthritis No date: Sleep apnea No date: Umbilical hernia  Past Surgical History: No date: APPENDECTOMY 01/18/2018: COLONOSCOPY WITH PROPOFOL; N/A     Comment:  Procedure: COLONOSCOPY WITH PROPOFOL;  Surgeon: Scot Jun, MD;  Location: [facility] ENDOSCOPY;  Service:               Endoscopy;  Laterality: N/A; No date: POLYPECTOMY No date: UMBILICAL HERNIA REPAIR     Reproductive/Obstetrics negative OB ROS                              Anesthesia Physical Anesthesia Plan  ASA: 2  Anesthesia Plan: General   Post-op Pain Management:    Induction:   PONV Risk Score and Plan: Propofol infusion and TIVA  Airway Management Planned:   Additional Equipment:   Intra-op Plan:    Post-operative Plan:   Informed Consent: I have reviewed the patients History and Physical, chart, labs and discussed the procedure including the risks, benefits and alternatives for the proposed anesthesia with the patient or authorized representative who has indicated his/her understanding and acceptance.     Dental Advisory Given  Plan Discussed with: CRNA and Surgeon  Anesthesia Plan Comments:          Anesthesia Quick Evaluation

## 2023-10-02 NOTE — Anesthesia Postprocedure Evaluation (Signed)
Anesthesia Post Note  Patient: Darrell Burton  Procedure(s) Performed: COLONOSCOPY WITH PROPOFOL  Patient location during evaluation: Endoscopy Anesthesia Type: General Level of consciousness: awake and alert Pain management: pain level controlled Vital Signs Assessment: post-procedure vital signs reviewed and stable Respiratory status: spontaneous breathing, nonlabored ventilation and respiratory function stable Cardiovascular status: blood pressure returned to baseline and stable Postop Assessment: no apparent nausea or vomiting Anesthetic complications: no   No notable events documented.   Last Vitals:  Vitals:   10/02/23 0857 10/02/23 0908  BP: 110/75 125/84  Pulse:    Resp:    Temp:    SpO2:      Last Pain:  Vitals:   10/02/23 0908  TempSrc:   PainSc: 0-No pain                 Foye Deer

## 2023-10-02 NOTE — H&P (Signed)
  Outpatient short stay form Pre-procedure 10/02/2023 8:18 AM Maeve Debord K. Norma Fredrickson, M.D.  Primary Physician: Einar Crow, M.D.  Reason for visit:  Personal history of adenomatous colon polyps - 2019.  History of present illness:                            Patient presents for colonoscopy for a personal hx of colon polyps. The patient denies abdominal pain, abnormal weight loss or rectal bleeding.  -01/18/2018 LAST colonoscopy: Indication personal history of colon polyps impression 2 diminutive Tubular adenomatous polyps, diverticulosis, internal hemorrhoids. Repeat 5 years.     Current Facility-Administered Medications:    0.9 %  sodium chloride infusion, , Intravenous, Continuous, [place], Boykin Nearing, MD, Last Rate: 20 mL/hr at 10/02/23 0808, New Bag at 10/02/23 0808  Medications Prior to Admission  Medication Sig Dispense Refill Last Dose   aspirin EC 81 MG tablet Take 81 mg by mouth every morning.   Past Month   atenolol (TENORMIN) 25 MG tablet Take 25 mg by mouth daily.      atorvastatin (LIPITOR) 20 MG tablet Take 20 mg by mouth daily.   10/01/2023   Cholecalciferol 25 MCG (1000 UT) capsule Take 1 capsule by mouth daily.   10/01/2023   LUMIGAN 0.01 % SOLN SMARTSIG:In Eye(s)   10/02/2023 at 0500   timolol (TIMOPTIC) 0.5 % ophthalmic solution 1 drop every morning.   10/01/2023     Allergies  Allergen Reactions   Augmentin [Amoxicillin-Pot Clavulanate] Other (See Comments)    hepatitis     Past Medical History:  Diagnosis Date   A-fib (HCC)    Aortic atherosclerosis (HCC)    Dysrhythmia    Erectile dysfunction    Glaucoma    Hemorrhoids    Hepatitis A    Hypercholesteremia    Hyperlipidemia    Lung mass    Non-alcoholic fatty liver disease    Osteoarthritis    Sleep apnea    Umbilical hernia     Review of systems:  Otherwise negative.    Physical Exam  Gen: Alert, oriented. Appears stated age.  HEENT: [place]/AT. PERRLA. Lungs: CTA, no wheezes. CV: RR nl S1,  S2. Abd: soft, benign, no masses. BS+ Ext: No edema. Pulses 2+    Planned procedures: Proceed with colonoscopy. The patient understands the nature of the planned procedure, indications, risks, alternatives and potential complications including but not limited to bleeding, infection, perforation, damage to internal organs and possible oversedation/side effects from anesthesia. The patient agrees and gives consent to proceed.  Please refer to procedure notes for findings, recommendations and patient disposition/instructions.     Aeon Koors K. Norma Fredrickson, M.D. Gastroenterology 10/02/2023  8:18 AM

## 2023-10-03 ENCOUNTER — Encounter: Payer: Self-pay | Admitting: Internal Medicine

## 2024-02-28 ENCOUNTER — Ambulatory Visit: Payer: Medicare HMO | Admitting: Dermatology

## 2024-03-11 ENCOUNTER — Ambulatory Visit: Admitting: Dermatology

## 2024-03-25 ENCOUNTER — Ambulatory Visit: Admitting: Dermatology

## 2024-03-25 ENCOUNTER — Encounter: Payer: Self-pay | Admitting: Dermatology

## 2024-03-25 DIAGNOSIS — D1801 Hemangioma of skin and subcutaneous tissue: Secondary | ICD-10-CM

## 2024-03-25 DIAGNOSIS — Z1283 Encounter for screening for malignant neoplasm of skin: Secondary | ICD-10-CM | POA: Diagnosis not present

## 2024-03-25 DIAGNOSIS — D235 Other benign neoplasm of skin of trunk: Secondary | ICD-10-CM

## 2024-03-25 DIAGNOSIS — L821 Other seborrheic keratosis: Secondary | ICD-10-CM

## 2024-03-25 DIAGNOSIS — L820 Inflamed seborrheic keratosis: Secondary | ICD-10-CM | POA: Diagnosis not present

## 2024-03-25 DIAGNOSIS — L814 Other melanin hyperpigmentation: Secondary | ICD-10-CM

## 2024-03-25 DIAGNOSIS — D229 Melanocytic nevi, unspecified: Secondary | ICD-10-CM

## 2024-03-25 DIAGNOSIS — W908XXA Exposure to other nonionizing radiation, initial encounter: Secondary | ICD-10-CM

## 2024-03-25 DIAGNOSIS — D239 Other benign neoplasm of skin, unspecified: Secondary | ICD-10-CM

## 2024-03-25 DIAGNOSIS — Z808 Family history of malignant neoplasm of other organs or systems: Secondary | ICD-10-CM

## 2024-03-25 DIAGNOSIS — L578 Other skin changes due to chronic exposure to nonionizing radiation: Secondary | ICD-10-CM | POA: Diagnosis not present

## 2024-03-25 DIAGNOSIS — Z7189 Other specified counseling: Secondary | ICD-10-CM

## 2024-03-25 NOTE — Patient Instructions (Addendum)

## 2024-03-25 NOTE — Progress Notes (Signed)
   Follow-Up Visit   Subjective  Darrell Burton is a 73 y.o. male who presents for the following: Skin Cancer Screening and Upper Body Skin Exam, patient report his father with a history of skin cancer.   The patient presents for Upper Body Skin Exam (UBSE) for skin cancer screening and mole check. The patient has spots, moles and lesions to be evaluated, some may be new or changing and the patient may have concern these could be cancer.  The following portions of the chart were reviewed this encounter and updated as appropriate: medications, allergies, medical history  Review of Systems:  No other skin or systemic complaints except as noted in HPI or Assessment and Plan.  Objective  Well appearing patient in no apparent distress; mood and affect are within normal limits.  All skin waist up examined. Relevant physical exam findings are noted in the Assessment and Plan.  left zygoma x 1, scalp x 3 (4) Stuck-on, waxy, tan-brown papule or plaque --Discussed benign etiology and prognosis.   Assessment & Plan   INFLAMED SEBORRHEIC KERATOSIS (4) left zygoma x 1, scalp x 3 (4) Symptomatic, irritating, patient would like treated.  Destruction of lesion - left zygoma x 1, scalp x 3 (4) Complexity: simple   Destruction method: cryotherapy   Informed consent: discussed and consent obtained   Timeout:  patient name, date of birth, surgical site, and procedure verified Lesion destroyed using liquid nitrogen: Yes   Region frozen until ice ball extended beyond lesion: Yes   Outcome: patient tolerated procedure well with no complications   Post-procedure details: wound care instructions given   FAMILY HISTORY OF SKIN CANCER   ACTINIC SKIN DAMAGE   LENTIGO   MELANOCYTIC NEVUS, UNSPECIFIED LOCATION   SKIN CANCER SCREENING    Skin cancer screening performed today.  Actinic Damage - Chronic condition, secondary to cumulative UV/sun exposure - diffuse scaly erythematous macules with  underlying dyspigmentation - Recommend daily broad spectrum sunscreen SPF 30+ to sun-exposed areas, reapply every 2 hours as needed.  - Staying in the shade or wearing long sleeves, sun glasses (UVA+UVB protection) and wide brim hats (4-inch brim around the entire circumference of the hat) are also recommended for sun protection.  - Call for new or changing lesions.  Lentigines, Seborrheic Keratoses, Hemangiomas - Benign normal skin lesions - Benign-appearing - Call for any changes  Melanocytic Nevi - Tan-brown and/or pink-flesh-colored symmetric macules and papules - Benign appearing on exam today - Observation - Call clinic for new or changing moles - Recommend daily use of broad spectrum spf 30+ sunscreen to sun-exposed areas.   DERMATOFIBROMA vs SCAR Exam: Firm pink/brown papulenodule with dimple sign left inferior scapula  Treatment Plan: A dermatofibroma is a benign growth possibly related to trauma, such as an insect bite, cut from shaving, or inflamed acne-type bump.  Treatment options to remove include shave or excision with resulting scar and risk of recurrence.  Since benign-appearing and not bothersome, will observe for now.    FAMILY HISTORY OF SKIN CANCER   Return in about 1 year (around 03/25/2025) for TBSE, family hx of skin cancer .  IClara Crisp, CMA, am acting as scribe for Celine Collard, MD .   Documentation: I have reviewed the above documentation for accuracy and completeness, and I agree with the above.  Celine Collard, MD

## 2025-03-25 ENCOUNTER — Ambulatory Visit: Admitting: Dermatology
